# Patient Record
Sex: Female | Born: 1940 | Race: White | Hispanic: No | Marital: Married | Smoking: Never smoker
Health system: Southern US, Community
[De-identification: ages and names within clinical notes are randomized; demographics above are authoritative.]

## PROBLEM LIST (undated history)

## (undated) DIAGNOSIS — R413 Other amnesia: Secondary | ICD-10-CM

## (undated) DIAGNOSIS — E785 Hyperlipidemia, unspecified: Secondary | ICD-10-CM

## (undated) DIAGNOSIS — J45909 Unspecified asthma, uncomplicated: Secondary | ICD-10-CM

## (undated) DIAGNOSIS — Z972 Presence of dental prosthetic device (complete) (partial): Secondary | ICD-10-CM

## (undated) DIAGNOSIS — I10 Essential (primary) hypertension: Secondary | ICD-10-CM

## (undated) HISTORY — PX: ANKLE FRACTURE SURGERY: SHX122

## (undated) HISTORY — PX: GASTRIC BYPASS: SHX52

---

## 2003-03-27 ENCOUNTER — Other Ambulatory Visit: Payer: Self-pay

## 2005-04-24 ENCOUNTER — Other Ambulatory Visit: Payer: Self-pay

## 2005-04-30 ENCOUNTER — Ambulatory Visit: Payer: Self-pay | Admitting: Vascular Surgery

## 2009-05-02 ENCOUNTER — Ambulatory Visit: Payer: Self-pay | Admitting: Gastroenterology

## 2014-12-28 ENCOUNTER — Emergency Department
Admission: EM | Admit: 2014-12-28 | Discharge: 2014-12-28 | Disposition: A | Discharge status: Home / Self Care | Payer: Medicare PPO | Attending: Emergency Medicine | Admitting: Emergency Medicine

## 2014-12-28 DIAGNOSIS — K921 Melena: Secondary | ICD-10-CM | POA: Insufficient documentation

## 2014-12-28 DIAGNOSIS — I10 Essential (primary) hypertension: Secondary | ICD-10-CM | POA: Diagnosis not present

## 2014-12-28 DIAGNOSIS — R195 Other fecal abnormalities: Secondary | ICD-10-CM

## 2014-12-28 DIAGNOSIS — K625 Hemorrhage of anus and rectum: Secondary | ICD-10-CM | POA: Diagnosis present

## 2014-12-28 HISTORY — DX: Essential (primary) hypertension: I10

## 2014-12-28 LAB — CBC
HCT: 40 % (ref 35.0–47.0)
Hemoglobin: 12.9 g/dL (ref 12.0–16.0)
MCH: 28.3 pg (ref 26.0–34.0)
MCHC: 32.3 g/dL (ref 32.0–36.0)
MCV: 87.4 fL (ref 80.0–100.0)
PLATELETS: 239 10*3/uL (ref 150–440)
RBC: 4.57 MIL/uL (ref 3.80–5.20)
RDW: 13.1 % (ref 11.5–14.5)
WBC: 6.5 10*3/uL (ref 3.6–11.0)

## 2014-12-28 LAB — COMPREHENSIVE METABOLIC PANEL
ALBUMIN: UNDETERMINED g/dL (ref 3.5–5.0)
ALK PHOS: 73 U/L (ref 38–126)
ALT: UNDETERMINED U/L (ref 14–54)
ALT: 13 U/L — ABNORMAL LOW (ref 14–54)
AST: 19 U/L (ref 15–41)
AST: 19 U/L (ref 15–41)
Albumin: 4.2 g/dL (ref 3.5–5.0)
Alkaline Phosphatase: 77 U/L (ref 38–126)
Anion gap: 10 (ref 5–15)
Anion gap: 10 (ref 5–15)
BILIRUBIN TOTAL: UNDETERMINED mg/dL (ref 0.3–1.2)
BUN: UNDETERMINED mg/dL (ref 6–20)
BUN: 12 mg/dL (ref 6–20)
CALCIUM: 8.5 mg/dL — AB (ref 8.9–10.3)
CHLORIDE: 97 mmol/L — AB (ref 101–111)
CHLORIDE: 95 mmol/L — AB (ref 101–111)
CO2: 22 mmol/L (ref 22–32)
CO2: 29 mmol/L (ref 22–32)
CREATININE: UNDETERMINED mg/dL (ref 0.44–1.00)
Calcium: 9.1 mg/dL (ref 8.9–10.3)
Creatinine, Ser: 0.68 mg/dL (ref 0.44–1.00)
Glucose, Bld: 93 mg/dL (ref 65–99)
Glucose, Bld: 100 mg/dL — ABNORMAL HIGH (ref 65–99)
POTASSIUM: 3.7 mmol/L (ref 3.5–5.1)
Potassium: 3.8 mmol/L (ref 3.5–5.1)
SODIUM: 129 mmol/L — AB (ref 135–145)
SODIUM: 134 mmol/L — AB (ref 135–145)
Total Bilirubin: 0.5 mg/dL (ref 0.3–1.2)
Total Protein: UNDETERMINED g/dL (ref 6.5–8.1)
Total Protein: 7.2 g/dL (ref 6.5–8.1)

## 2014-12-28 NOTE — Discharge Instructions (Signed)
You have been seen in the emergency department for red stool. Your workup as shown normal results. Your stool does not contain any blood at this time. Please follow-up with your primary care physician as needed, monitor your bowel movements to ensure resolution of the red-colored stool over the next 2-3 days.

## 2014-12-28 NOTE — ED Notes (Signed)
Pt arrives to ER because she is concerned that she is loosing blood through her stool. Reports X 1 BM this AM that was "tinged" pink. Pt hx of "low blood". Pt denies any other sx. Pt alert and oriented X4, active, cooperative, pt in NAD. RR even and unlabored, color WNL.

## 2014-12-28 NOTE — ED Provider Notes (Addendum)
[facility] Emergency Department Provider Note  Time seen: 4:15 PM  I have reviewed the triage vital signs and the nursing notes.   HISTORY  Chief Complaint Rectal Bleeding    HPI Jeanne Stewart is a 74 y.o. female with a past medical history of hypertension who presents the emergency department with red stool. According to the patient she stated she had a bowel movement today and noted that her stool looked pink, and the toilet water looked pink as well.Patient denies any abdominal pain, nausea, vomiting, recent black stool, any other bleeding. Patient denies any history of GI bleeding in the past. Patient states she does take weekly iron supplements for anemia.     Past Medical History  Diagnosis Date  . Hypertension     There are no active problems to display for this patient.   History reviewed. No pertinent past surgical history.  No current outpatient prescriptions on file.  Allergies Review of patient's allergies indicates no known allergies.  No family history on file.  Social History Social History  Substance Use Topics  . Smoking status: Never Smoker   . Smokeless tobacco: None  . Alcohol Use: No    Review of Systems Constitutional: Negative for fever. Cardiovascular: Negative for chest pain. Respiratory: Negative for shortness of breath. Gastrointestinal: Negative for abdominal pain. Positive for red stool 1. Negative for diarrhea. Musculoskeletal: Negative for back pain. Neurological: Negative for headache 10-point ROS otherwise negative.  ____________________________________________   PHYSICAL EXAM:  VITAL SIGNS: ED Triage Vitals  Enc Vitals Group     BP 12/28/14 1454 146/77 mmHg     Pulse Rate 12/28/14 1454 75     Resp 12/28/14 1454 18     Temp 12/28/14 1454 98.1 F (36.7 C)     Temp Source 12/28/14 1454 Oral     SpO2 12/28/14 1454 97 %     Weight 12/28/14 1454 170 lb (77.111 kg)     Height 12/28/14 1454 4'  10" (1.473 m)     Head Cir --      [facility] Flow --      Pain Score --      Pain Loc --      Pain Edu? --      Excl. in GC? --     Constitutional: Alert and oriented. Well appearing and in no distress. Eyes: Normal exam ENT   Head: Normocephalic and atraumatic   Mouth/Throat: Mucous membranes are moist. Cardiovascular: Normal rate, regular rhythm. No murmur Respiratory: Normal respiratory effort without tachypnea nor retractions. Breath sounds are clear  Gastrointestinal: Soft and nontender. No distention. Rectal exam shows red-appearing stool, guaiac negative. Musculoskeletal: Nontender with normal range of motion in all extremities.  Neurologic:  Normal speech and language. No gross focal neurologic deficits Skin:  Skin is warm, dry and intact.  Psychiatric: Mood and affect are normal. Speech and behavior are normal.  ____________________________________________    EKG reviewed and interpreted, so shows normal sinus rhythm at 75 bpm, narrow QRS, normal axis, normal intervals, no ST changes. Overall normal EKG.   INITIAL IMPRESSION / ASSESSMENT AND PLAN / ED COURSE  Pertinent labs & imaging results that were available during my care of the patient were reviewed by me and considered in my medical decision making (see chart for details).  Patient presents with red stool times one bowel movement. Denies any diarrhea, any other bleeding, denies any history of GI bleeding in the past. Rectal exam shows red-appearing stool however  the stool is guaiac-negative. Upon further questioning the patient states she has been eating red velvet cookies, which could be the culprit for her red stool. Labs within normal limits, exam otherwise benign. We'll discharge the patient home with primary care follow-up as needed.    ____________________________________________   FINAL CLINICAL IMPRESSION(S) / ED DIAGNOSES  Red stool   Minna AntisKevin Geneive Sandstrom, MD 12/28/14 [id number]  Minna AntisKevin Odai Wimmer,  MD 12/28/14 [phone]

## 2015-03-31 ENCOUNTER — Encounter: Payer: Self-pay | Admitting: Emergency Medicine

## 2015-03-31 ENCOUNTER — Emergency Department: Payer: Medicare PPO

## 2015-03-31 ENCOUNTER — Emergency Department
Admission: EM | Admit: 2015-03-31 | Discharge: 2015-03-31 | Disposition: A | Discharge status: Home / Self Care | Payer: Medicare PPO | Attending: Emergency Medicine | Admitting: Emergency Medicine

## 2015-03-31 DIAGNOSIS — R05 Cough: Secondary | ICD-10-CM | POA: Diagnosis present

## 2015-03-31 DIAGNOSIS — B349 Viral infection, unspecified: Secondary | ICD-10-CM

## 2015-03-31 DIAGNOSIS — J9801 Acute bronchospasm: Secondary | ICD-10-CM | POA: Insufficient documentation

## 2015-03-31 DIAGNOSIS — Z79899 Other long term (current) drug therapy: Secondary | ICD-10-CM | POA: Diagnosis not present

## 2015-03-31 DIAGNOSIS — I10 Essential (primary) hypertension: Secondary | ICD-10-CM | POA: Insufficient documentation

## 2015-03-31 MED ORDER — ALBUTEROL SULFATE HFA 108 (90 BASE) MCG/ACT IN AERS: 1.0000 | INHALATION_SPRAY | Freq: Four times a day (QID) | RESPIRATORY_TRACT | Status: DC | PRN

## 2015-03-31 NOTE — ED Notes (Signed)
AAOx3.  Skin warm and dry.  NAD 

## 2015-03-31 NOTE — ED Notes (Signed)
Pt to ed with c/o cough, congestion, sob x 5 days.  Denies fever. Denies sore throat.

## 2015-03-31 NOTE — Discharge Instructions (Signed)

## 2015-03-31 NOTE — ED Provider Notes (Signed)
[facility] Emergency Department Provider Note  ____________________________________________  Time seen: Approximately 4:37 PM  I have reviewed the triage vital signs and the nursing notes.   HISTORY  Chief Complaint Cough; Nasal Congestion; and Shortness of Breath    HPI Jeanne Stewart is a 75 y.o. female , NAD, presents to the emergency department with cough, chest congestion and wheezing for approximately 5 days. Patient states that she has a "cold in her chest". States she has long history of asthma and these symptoms occur during weather change. States she uses Combivent inhaler every day but does not have a rescue inhaler. States she normally goes to [facility] for treatment but unfortunately they were closed when she arrived this afternoon. Has not had any fever, chills, body aches, chest pain, back pain, abdominal pain, nausea, vomiting, diarrhea.   Past Medical History  Diagnosis Date  . Hypertension     There are no active problems to display for this patient.   History reviewed. No pertinent past surgical history.  Current Outpatient Rx  Name  Route  Sig  Dispense  Refill  . albuterol (PROVENTIL HFA;VENTOLIN HFA) 108 (90 Base) MCG/ACT inhaler   Inhalation   Inhale 1-2 puffs into the lungs every 6 (six) hours as needed for wheezing or shortness of breath.   1 Inhaler   0   . COMBIVENT RESPIMAT 20-100 MCG/ACT AERS respimat   Oral   Take 1 puff by mouth every 6 (six) hours as needed.       0     Dispense as written.   Marland Kitchen CRESTOR 10 MG tablet   Oral   Take 1 tablet by mouth daily.      0     Dispense as written.   Marland Kitchen lisinopril-hydrochlorothiazide (PRINZIDE,ZESTORETIC) 20-25 MG tablet   Oral   Take 1 tablet by mouth daily.      0   . montelukast (SINGULAIR) 10 MG tablet   Oral   Take 1 tablet by mouth daily as needed.      0   . Vitamin D, Ergocalciferol, (DRISDOL) 50000 UNITS CAPS capsule   Oral   Take 50,000 Units  by mouth once a week.      0     Allergies Review of patient's allergies indicates no known allergies.  History reviewed. No pertinent family history.  Social History Social History  Substance Use Topics  . Smoking status: Never Smoker   . Smokeless tobacco: None  . Alcohol Use: No     Review of Systems Constitutional: No fever/chills, fatigue Eyes: No visual changes. No discharge ENT: No sore throat, nasal congestion, ear pain. Cardiovascular: No chest pain. Respiratory: Positive cough, chest congestion, wheezing.  No wheezing.  Gastrointestinal: No abdominal pain.  No nausea, vomiting.  No diarrhea. Musculoskeletal: Negative for back pain.  Skin: Negative for rash. Neurological: Negative for headaches, focal weakness or numbness. 10-point ROS otherwise negative.  ____________________________________________   PHYSICAL EXAM:  VITAL SIGNS: ED Triage Vitals  Enc Vitals Group     BP 03/31/15 1630 146/64 mmHg     Pulse Rate 03/31/15 1630 88     Resp 03/31/15 1630 22     Temp 03/31/15 1630 97.9 F (36.6 C)     Temp Source 03/31/15 1630 Oral     SpO2 03/31/15 1630 95 %     Weight 03/31/15 1630 165 lb (74.844 kg)     Height 03/31/15 1630  (1.499 m)  Head Cir --      [facility] Flow --      Pain Score 03/31/15 1631 0     Pain Loc --      Pain Edu? --      Excl. in GC? --     Constitutional: Alert and oriented. Well appearing and in no acute distress. Talking in complete sentences and interacting without distress with this provider throughout her visit. Eyes: Conjunctivae are normal. Head: Atraumatic. ENT:      Ears: No discharge      Nose: No congestion/rhinnorhea.      Mouth/Throat: Mucous membranes are moist.  Neck: Supple with full range of motion. Hematological/Lymphatic/Immunilogical: No cervical lymphadenopathy. Cardiovascular: Normal rate, regular rhythm. Normal S1 and S2.  Good peripheral circulation. Respiratory: Normal respiratory effort  without tachypnea or retractions. Mild wheeze noted in upper lung fields without rhonchi or rales. Lower lung fields without wheeze, rhonchi, rales. Breath sounds noted throughout all lung fields. Neurologic:  Normal speech and language. No gross focal neurologic deficits are appreciated.  Skin:  Skin is warm, dry and intact. No rash noted. Psychiatric: Mood and affect are normal. Speech and behavior are normal. Patient exhibits appropriate insight and judgement.   ____________________________________________   LABS  None  ____________________________________________  EKG  None ____________________________________________  RADIOLOGY  None - Patient refused CXR after I fully discussed pros and cons of the imaging process. Patient verbalized that she would know if she had pneumonia and felt her symptoms were consistent with a chest cold.  ____________________________________________    PROCEDURES  Procedure(s) performed: None      Medications - No data to display  Patient declined breathing treatment while in the emergency department. She states she just wants an inhaler she can use at home. ____________________________________________   INITIAL IMPRESSION / ASSESSMENT AND PLAN / ED COURSE  Patient's diagnosis is consistent with acute bronchospasm due to viral infection. Patient will be discharged home with prescriptions for albuterol inhaler to use as directed. [facility] if symptoms persist past this treatment course. Patient is given ED precautions to return to the ED for any worsening or new symptoms.    ____________________________________________  FINAL CLINICAL IMPRESSION(S) / ED DIAGNOSES  Final diagnoses:  Acute bronchospasm due to viral infection      NEW MEDICATIONS STARTED DURING THIS VISIT:  New Prescriptions   ALBUTEROL (PROVENTIL HFA;VENTOLIN HFA) 108 (90 BASE) MCG/ACT INHALER    Inhale 1-2 puffs  into the lungs every 6 (six) hours as needed for wheezing or shortness of breath.         Hope PigeonJami L Latressa Harries, PA-C 03/31/15 1703  Jennye MoccasinBrian S Quigley, MD 03/31/15 Corky [id number]

## 2016-10-24 ENCOUNTER — Emergency Department: Payer: Medicare PPO

## 2016-10-24 ENCOUNTER — Emergency Department
Admission: EM | Admit: 2016-10-24 | Discharge: 2016-10-24 | Disposition: A | Discharge status: Home / Self Care | Payer: Medicare PPO | Attending: Emergency Medicine | Admitting: Emergency Medicine

## 2016-10-24 ENCOUNTER — Encounter: Payer: Self-pay | Admitting: Emergency Medicine

## 2016-10-24 DIAGNOSIS — R0600 Dyspnea, unspecified: Secondary | ICD-10-CM

## 2016-10-24 DIAGNOSIS — I10 Essential (primary) hypertension: Secondary | ICD-10-CM | POA: Diagnosis not present

## 2016-10-24 DIAGNOSIS — Z79899 Other long term (current) drug therapy: Secondary | ICD-10-CM | POA: Diagnosis not present

## 2016-10-24 DIAGNOSIS — J45909 Unspecified asthma, uncomplicated: Secondary | ICD-10-CM | POA: Insufficient documentation

## 2016-10-24 DIAGNOSIS — R0602 Shortness of breath: Secondary | ICD-10-CM | POA: Diagnosis present

## 2016-10-24 LAB — CBC
HEMATOCRIT: 40.6 % (ref 35.0–47.0)
HEMOGLOBIN: 13.9 g/dL (ref 12.0–16.0)
MCH: 29.6 pg (ref 26.0–34.0)
MCHC: 34.3 g/dL (ref 32.0–36.0)
MCV: 86.5 fL (ref 80.0–100.0)
Platelets: 287 10*3/uL (ref 150–440)
RBC: 4.69 MIL/uL (ref 3.80–5.20)
RDW: 13.4 % (ref 11.5–14.5)
WBC: 6.7 10*3/uL (ref 3.6–11.0)

## 2016-10-24 LAB — COMPREHENSIVE METABOLIC PANEL
ALBUMIN: 4.6 g/dL (ref 3.5–5.0)
ALK PHOS: 58 U/L (ref 38–126)
ALT: 20 U/L (ref 14–54)
ANION GAP: 10 (ref 5–15)
AST: 26 U/L (ref 15–41)
BILIRUBIN TOTAL: 0.8 mg/dL (ref 0.3–1.2)
BUN: 18 mg/dL (ref 6–20)
CALCIUM: 9.6 mg/dL (ref 8.9–10.3)
CO2: 28 mmol/L (ref 22–32)
CREATININE: 1.06 mg/dL — AB (ref 0.44–1.00)
Chloride: 93 mmol/L — ABNORMAL LOW (ref 101–111)
GFR calc Af Amer: 57 mL/min — ABNORMAL LOW (ref >60)
GFR calc non Af Amer: 49 mL/min — ABNORMAL LOW (ref >60)
GLUCOSE: 107 mg/dL — AB (ref 65–99)
Potassium: 4.2 mmol/L (ref 3.5–5.1)
Sodium: 131 mmol/L — ABNORMAL LOW (ref 135–145)
TOTAL PROTEIN: 7.8 g/dL (ref 6.5–8.1)

## 2016-10-24 LAB — TROPONIN I: Troponin I: <0.03 ng/mL (ref <0.03)

## 2016-10-24 MED ORDER — IPRATROPIUM-ALBUTEROL 0.5-2.5 (3) MG/3ML IN SOLN
3.0000 mL | Freq: Once | RESPIRATORY_TRACT | Status: AC
  Administered 2016-10-24: 3 mL via RESPIRATORY_TRACT
  Filled 2016-10-24: qty 3

## 2016-10-24 MED ORDER — METHYLPREDNISOLONE SODIUM SUCC 125 MG IJ SOLR
125.0000 mg | Freq: Once | INTRAMUSCULAR | Status: AC
  Administered 2016-10-24: 125 mg via INTRAVENOUS
  Filled 2016-10-24: qty 2

## 2016-10-24 MED ORDER — HYDROMORPHONE HCL 1 MG/ML IJ SOLN
INTRAMUSCULAR | Status: AC
  Filled 2016-10-24: qty 1

## 2016-10-24 MED ORDER — ALBUTEROL SULFATE HFA 108 (90 BASE) MCG/ACT IN AERS: 2.0000 | INHALATION_SPRAY | Freq: Four times a day (QID) | RESPIRATORY_TRACT | 0 refills | Status: DC | PRN

## 2016-10-24 MED ORDER — COMBIVENT RESPIMAT 20-100 MCG/ACT IN AERS: 1.0000 | INHALATION_SPRAY | Freq: Four times a day (QID) | RESPIRATORY_TRACT | 0 refills | Status: DC | PRN

## 2016-10-24 MED ORDER — PREDNISONE 20 MG PO TABS: 40.0000 mg | ORAL_TABLET | Freq: Every day | ORAL | 0 refills | Status: DC

## 2016-10-24 NOTE — Discharge Instructions (Signed)
As we discussed if you have any increased shortness of breath or develop any chest pain please return immediately to the emergency department.  Otherwise please follow-up with your doctor soon as possible.  Take your steroids as prescribed and use your inhalers as needed as written.

## 2016-10-24 NOTE — ED Notes (Signed)

## 2016-10-24 NOTE — ED Triage Notes (Signed)
Pt to ed with c/o labored resp and sob.  Pt with nasal flaring, labored resp, and appears in resp distress.  Pt sats 85% at triage.

## 2016-10-24 NOTE — ED Provider Notes (Signed)
[facility] Emergency Department Provider Note  Time seen: 1:19 PM  I have reviewed the triage vital signs and the nursing notes.   HISTORY  Chief Complaint Shortness of Breath    HPI Jeanne Stewart is a 76 y.o. female with a past medical history of hypertension, asthma, who presents to the emergency department with difficulty breathing.  According to the patient since yesterday she has been having progressively worsening difficulty breathing.  States she is having wheeze, mild chest pain/tightness.  Denies any fever, cough or congestion.  Denies COPD history.  Patient is a non-smoker.  Upon arrival the patient is tachypneic moving little air bilaterally.  Afebrile, but has a room air saturation of 86%.  No home O2 requirement.   Past Medical History:  Diagnosis Date  . Hypertension     There are no active problems to display for this patient.   History reviewed. No pertinent surgical history.  Prior to Admission medications   Medication Sig Start Date End Date Taking? Authorizing Provider  albuterol (PROVENTIL HFA;VENTOLIN HFA) 108 (90 Base) MCG/ACT inhaler Inhale 1-2 puffs into the lungs every 6 (six) hours as needed for wheezing or shortness of breath. 03/31/15   Hagler, Jami L, PA-C  COMBIVENT RESPIMAT 20-100 MCG/ACT AERS respimat Take 1 puff by mouth every 6 (six) hours as needed.  12/17/14   [provider]  CRESTOR 10 MG tablet Take 1 tablet by mouth daily. 12/22/14   [provider]  lisinopril-hydrochlorothiazide (PRINZIDE,ZESTORETIC) 20-25 MG tablet Take 1 tablet by mouth daily. 12/22/14   [provider]  montelukast (SINGULAIR) 10 MG tablet Take 1 tablet by mouth daily as needed. 12/22/14   [provider]  Vitamin D, Ergocalciferol, (DRISDOL) 50000 UNITS CAPS capsule Take 50,000 Units by mouth once a week. 11/07/14   [provider]    No Known Allergies  No family history on file.  Social  History Social History  Substance Use Topics  . Smoking status: Never Smoker  . Smokeless tobacco: Never Used  . Alcohol use No    Review of Systems Constitutional: Negative for fever. Cardiovascular: Negative for chest pain. Respiratory: Positive for shortness of breath Gastrointestinal: Negative for abdominal pain Neurological: Negative for headache All other ROS negative  ____________________________________________   PHYSICAL EXAM:  VITAL SIGNS: ED Triage Vitals [10/24/16 1308]  Enc Vitals Group     BP (!) 166/76     Pulse Rate 96     Resp (!) 36     Temp 98.2 F (36.8 C)     Temp Source Oral     SpO2 (!) 86 %     Weight 165 lb (74.8 kg)     Height      Head Circumference      [facility] Flow      Pain Score 8     Pain Loc      Pain Edu?      Excl. in GC?    Constitutional: Alert and oriented. Well appearing and in no distress. Eyes: Normal exam ENT   Head: Normocephalic and atraumatic   Mouth/Throat: Mucous membranes are moist. Cardiovascular: Normal rate, regular rhythm. No murmur Respiratory: Moderate tachypnea with mild expiratory wheeze and overall decreased breath sounds bilaterally. Gastrointestinal: Soft and nontender. No distention.   Musculoskeletal: Nontender with normal range of motion in all extremities. Neurologic:  Normal speech and language. No gross focal neurologic deficits Skin:  Skin is warm, dry and intact.  Psychiatric: Mood and  affect are normal.   ____________________________________________   RADIOLOGY  Chest x-ray negative  ____________________________________________   INITIAL IMPRESSION / ASSESSMENT AND PLAN / ED COURSE  Pertinent labs & imaging results that were available during my care of the patient were reviewed by me and considered in my medical decision making (see chart for details).  Patient presents to the emergency department for 2 days of worsening shortness of breath.  Patient does have mild expiratory  wheeze on exam with decreased respirations bilaterally.  Differential would include pneumonia, reactive airway disease, COPD, CHF.  Labs are largely at baseline.  Troponin negative.  Chest x-ray is normal.  Patient had a room air saturation of 86% on arrival.  We will treat with duo nebs, Solu-Medrol, continue to closely monitor.  Patient has a room air saturation varying between 92 and 96%.  Patient's wheezes gone.  She states she feels better.  I discussed with the patient given her age and initial hypoxia we could admit her to the hospital for further treatment.  Patient states she would much rather go home.  As she is currently satting 94% throughout my evaluation with a good waveform, respiratory rate has decreased between 12 and 18 and she is in no distress I believe she is safe for discharge home.  I discussed very strict return precautions with her and her family.  We will write prescriptions for Combivent and albuterol.  Patient agreeable to this plan.  EKG reviewed and interpreted by myself shows normal sinus rhythm 82 bpm, widened QRS, normal axis, normal intervals, nonspecific ST changes.   ____________________________________________   FINAL CLINICAL IMPRESSION(S) / ED DIAGNOSES  Reactive airway disease    Minna Antis, MD 10/24/16 1504

## 2017-09-23 ENCOUNTER — Encounter: Payer: Self-pay | Admitting: Emergency Medicine

## 2017-09-23 ENCOUNTER — Emergency Department
Admission: EM | Admit: 2017-09-23 | Discharge: 2017-09-23 | Disposition: A | Discharge status: Home / Self Care | Payer: Medicare HMO | Attending: Emergency Medicine | Admitting: Emergency Medicine

## 2017-09-23 ENCOUNTER — Emergency Department: Payer: Medicare HMO

## 2017-09-23 DIAGNOSIS — R531 Weakness: Secondary | ICD-10-CM | POA: Insufficient documentation

## 2017-09-23 DIAGNOSIS — R51 Headache: Secondary | ICD-10-CM | POA: Diagnosis not present

## 2017-09-23 DIAGNOSIS — R42 Dizziness and giddiness: Secondary | ICD-10-CM | POA: Diagnosis present

## 2017-09-23 DIAGNOSIS — I10 Essential (primary) hypertension: Secondary | ICD-10-CM | POA: Diagnosis not present

## 2017-09-23 DIAGNOSIS — Z79899 Other long term (current) drug therapy: Secondary | ICD-10-CM | POA: Insufficient documentation

## 2017-09-23 LAB — URINALYSIS, COMPLETE (UACMP) WITH MICROSCOPIC
Bacteria, UA: NONE SEEN
Bilirubin Urine: NEGATIVE
GLUCOSE, UA: NEGATIVE mg/dL
HGB URINE DIPSTICK: NEGATIVE
Ketones, ur: 5 mg/dL — AB
LEUKOCYTES UA: NEGATIVE
NITRITE: NEGATIVE
PROTEIN: NEGATIVE mg/dL
SPECIFIC GRAVITY, URINE: 1.006 (ref 1.005–1.030)
pH: 6 (ref 5.0–8.0)

## 2017-09-23 LAB — BASIC METABOLIC PANEL
ANION GAP: 10 (ref 5–15)
BUN: 12 mg/dL (ref 8–23)
CALCIUM: 10.2 mg/dL (ref 8.9–10.3)
CO2: 31 mmol/L (ref 22–32)
Chloride: 94 mmol/L — ABNORMAL LOW (ref 98–111)
Creatinine, Ser: 0.7 mg/dL (ref 0.44–1.00)
GLUCOSE: 136 mg/dL — AB (ref 70–99)
Potassium: 4.1 mmol/L (ref 3.5–5.1)
SODIUM: 135 mmol/L (ref 135–145)

## 2017-09-23 LAB — CBC
HCT: 38.5 % (ref 35.0–47.0)
HEMOGLOBIN: 13.3 g/dL (ref 12.0–16.0)
MCH: 30.7 pg (ref 26.0–34.0)
MCHC: 34.6 g/dL (ref 32.0–36.0)
MCV: 88.8 fL (ref 80.0–100.0)
Platelets: 270 10*3/uL (ref 150–440)
RBC: 4.33 MIL/uL (ref 3.80–5.20)
RDW: 14 % (ref 11.5–14.5)
WBC: 8.2 10*3/uL (ref 3.6–11.0)

## 2017-09-23 LAB — TROPONIN I
Troponin I: <0.03 ng/mL (ref <0.03)
Troponin I: <0.03 ng/mL (ref <0.03)

## 2017-09-23 LAB — GLUCOSE, CAPILLARY: Glucose-Capillary: 124 mg/dL — ABNORMAL HIGH (ref 70–99)

## 2017-09-23 MED ORDER — ONDANSETRON HCL 4 MG/2ML IJ SOLN: 4.0000 mg | Freq: Once | INTRAMUSCULAR | Status: DC | PRN

## 2017-09-23 MED ORDER — ONDANSETRON 4 MG PO TBDP
4.0000 mg | ORAL_TABLET | Freq: Once | ORAL | Status: AC | PRN
  Administered 2017-09-23: 4 mg via ORAL
  Filled 2017-09-23: qty 1

## 2017-09-23 MED ORDER — ONDANSETRON HCL 4 MG/2ML IJ SOLN
4.0000 mg | Freq: Once | INTRAMUSCULAR | Status: AC
  Administered 2017-09-23: 4 mg via INTRAVENOUS
  Filled 2017-09-23: qty 2

## 2017-09-23 MED ORDER — ONDANSETRON HCL 4 MG/2ML IJ SOLN
INTRAMUSCULAR | Status: AC
  Filled 2017-09-23: qty 2

## 2017-09-23 NOTE — ED Provider Notes (Signed)
-----------------------------------------   11:17 PM on 09/23/2017 -----------------------------------------   I took over care on this patient from Dr. Darnelle CatalanMalinda.  She was pending MRI of the brain and repeat troponin.  Both these are complete and the results are negative.  The patient continues to appear comfortable.  She is stable for discharge home.  Return precautions given, and she expressed understanding.   Dionne BucySiadecki, Jasmynn Pfalzgraf, MD 09/23/17 2317

## 2017-09-23 NOTE — ED Provider Notes (Signed)
[facility] Emergency Department Provider Note   ____________________________________________   First MD Initiated Contact with Patient 09/23/17 1706     (approximate)  I have reviewed the triage vital signs and the nursing notes.   HISTORY  Chief Complaint Dizziness    HPI Jeanne Stewart is a [age] y.o. female patient reports she was sitting at the dinner table and got dizzy and nauseated.  She says the dizziness was more like feeling worse seeing the plate that was in front of her move and she could not catch up with the plate because it was moving away from her.  She then got a mild headache.  This is not a severe headache at all just mild kind of diffuse in the upper back part of the head she says.  She still having the headache.  The feeling of things moving of the plate moving is now gone.  Patient has not eaten anything except for a few bites when the plate was moving since yesterday.  She says she only usually eats once a day and not very much when she eats.   Past Medical History:  Diagnosis Date  . Hypertension     There are no active problems to display for this patient.   History reviewed. No pertinent surgical history.  Prior to Admission medications   Medication Sig Start Date End Date Taking? Authorizing Provider  donepezil (ARICEPT) 5 MG tablet Take 5 mg by mouth at bedtime.   Yes [provider]  fluticasone furoate-vilanterol (BREO ELLIPTA) 100-25 MCG/INH AEPB Inhale 1 puff into the lungs daily.   Yes [provider]  lisinopril-hydrochlorothiazide (PRINZIDE,ZESTORETIC) 20-25 MG tablet Take 1 tablet by mouth daily. 12/22/14  Yes [provider]  montelukast (SINGULAIR) 10 MG tablet Take 10 mg by mouth daily.  12/22/14  Yes [provider]  rosuvastatin (CRESTOR) 10 MG tablet Take 10 mg by mouth daily. 09/29/16  Yes [provider]  albuterol (PROVENTIL HFA;VENTOLIN HFA) 108 (90 Base) MCG/ACT  inhaler Inhale 2 puffs into the lungs every 6 (six) hours as needed for wheezing or shortness of breath. Patient not taking: Reported on 09/23/2017 10/24/16   Minna AntisPaduchowski, Kevin, MD  COMBIVENT RESPIMAT 20-100 MCG/ACT AERS respimat Inhale 1 puff into the lungs every 6 (six) hours as needed for wheezing or shortness of breath. Patient not taking: Reported on 09/23/2017 10/24/16   Minna AntisPaduchowski, Kevin, MD    Allergies Patient has no known allergies.  No family history on file.  Social History Social History   Tobacco Use  . Smoking status: Never Smoker  . Smokeless tobacco: Never Used  Substance Use Topics  . Alcohol use: No  . Drug use: Not on file    Review of Systems  Constitutional: No fever/chills Eyes: No visual changes. ENT: No sore throat. Cardiovascular: Denies chest pain. Respiratory: Denies shortness of breath. Gastrointestinal: No abdominal pain.  No nausea, no vomiting.  No diarrhea.  No constipation. Genitourinary: Negative for dysuria. Musculoskeletal: Negative for back pain. Skin: Negative for rash. Neurological: Negative for headaches, focal weakness  ____________________________________________   PHYSICAL EXAM:  VITAL SIGNS: ED Triage Vitals  Enc Vitals Group     BP 09/23/17 1507 (!) 184/74     Pulse Rate 09/23/17 1505 72     Resp 09/23/17 1505 16     Temp 09/23/17 1505 97.8 F (36.6 C)     Temp Source 09/23/17 1505 Oral     SpO2 09/23/17 1505 95 %  Weight 09/23/17 1507 164 lb 14.5 oz (74.8 kg)     Height 09/23/17 1507 4\' 11"  (1.499 m)     Head Circumference --      Peak Flow --      Pain Score 09/23/17 1507 6     Pain Loc --      Pain Edu? --      Excl. in GC? --     Constitutional: Alert and oriented. Well appearing and in no acute distress. Eyes: Conjunctivae are normal. PERRL. EOMI. fundi are difficult to see but appear normal Head: Atraumatic. Nose: No congestion/rhinnorhea. Mouth/Throat: Mucous membranes are moist.  Oropharynx  non-erythematous. Neck: No stridor.   Cardiovascular: Normal rate, regular rhythm. Grossly normal heart sounds.  Good peripheral circulation. Respiratory: Normal respiratory effort.  No retractions. Lungs CTAB. Gastrointestinal: Soft and nontender. No distention. No abdominal bruits. No CVA tenderness. Musculoskeletal: No lower extremity tenderness nor edema.   Neurologic:  Normal speech and language. No gross focal neurologic deficits are appreciated.  Specifically cranial nerves II through XII are intact although visual fields were not checked cerebellar finger-to-nose rapid alternating movements and hands and heel-to-shin are all normal.  Patient does not complain of any numbness.  Motor strength is 5/5 throughout. Skin:  Skin is warm, dry and intact.  Psychiatric: Mood and affect are normal. Speech and behavior are normal.  ____________________________________________   LABS (all labs ordered are listed, but only abnormal results are displayed)  Labs Reviewed  BASIC METABOLIC PANEL - Abnormal; Notable for the following components:      Result Value   Chloride 94 (*)    Glucose, Bld 136 (*)    All other components within normal limits  URINALYSIS, COMPLETE (UACMP) WITH MICROSCOPIC - Abnormal; Notable for the following components:   Color, Urine STRAW (*)    APPearance CLEAR (*)    Ketones, ur 5 (*)    All other components within normal limits  GLUCOSE, CAPILLARY - Abnormal; Notable for the following components:   Glucose-Capillary 124 (*)    All other components within normal limits  CBC  TROPONIN I  TROPONIN I  CBG MONITORING, ED   ____________________________________________  EKG  EKG read interpreted by me shows normal sinus rhythm rate of 73 normal axis right bundle branch block no acute ST-T changes similar to prior ____________________________________________  RADIOLOGY  ED MD interpretation: X-ray shows no acute disease CT of the head also no acute disease either  read by radiology and reviewed by me  Official radiology report(s): Ct Head Wo Contrast  Result Date: 09/23/2017 CLINICAL DATA:  Weakness and dizziness. EXAM: CT HEAD WITHOUT CONTRAST TECHNIQUE: Contiguous axial images were obtained from the base of the skull through the vertex without intravenous contrast. COMPARISON:  None. FINDINGS: Brain: There is no mass, hemorrhage or extra-axial collection. The size and configuration of the ventricles and extra-axial CSF spaces are normal. There is no acute or chronic infarction. The brain parenchyma is normal. Vascular: No abnormal hyperdensity of the major intracranial arteries or dural venous sinuses. No intracranial atherosclerosis. Skull: The visualized skull base, calvarium and extracranial soft tissues are normal. Sinuses/Orbits: No fluid levels or advanced mucosal thickening of the visualized paranasal sinuses. No mastoid or middle ear effusion. The orbits are normal. IMPRESSION: Normal brain. Electronically Signed   By: Deatra Robinson M.D.   On: 09/23/2017 18:00   Dg Chest Portable 1 View  Result Date: 09/23/2017 CLINICAL DATA:  Weakness and dizziness. EXAM: PORTABLE CHEST 1 VIEW COMPARISON:  Chest radiograph 10/24/2016 FINDINGS: Small hiatal hernia. Lungs are clear. Normal cardiomediastinal contours. No pleural effusion or pneumothorax. IMPRESSION: Clear lungs.  Small hiatal hernia. Electronically Signed   By: Deatra Robinson M.D.   On: 09/23/2017 17:57    ____________________________________________   PROCEDURES  Procedure(s) performed:   Procedures  Critical Care performed:   ____________________________________________   INITIAL IMPRESSION / ASSESSMENT AND PLAN / ED COURSE Discussed patient with on-call neurologist from [facility].  He said to call tele-neurology even for non-stroke consult.  Tele-neurology says that we should get an MRI she should be okay to go home with the MRI is negative.  MRI is still pending as is a second  troponin sign the patient out to Dr. Marisa Severin.         ____________________________________________   FINAL CLINICAL IMPRESSION(S) / ED DIAGNOSES  Final diagnoses:  Dizziness     ED Discharge Orders    None       Note:  This document was prepared using Dragon voice recognition software and may include unintentional dictation errors.    Arnaldo Natal, MD 09/23/17 2049

## 2017-09-23 NOTE — ED Notes (Signed)
Pt refusing to eat/drink at this time for swallow eval and meal tray.

## 2017-09-23 NOTE — ED Triage Notes (Signed)
Pt arrived with family with concerns over dizziness and feeling of nausea. Pt states at 1330 pt felt very dizzy, "I thought I was going to pass out." Unclear if pt had loc but states she then felt nauseated and a headache started. Pt a& o in triage.

## 2017-09-23 NOTE — ED Notes (Signed)
Pt up to bed side to void , pt observed to have a steady gate at this time .

## 2017-09-23 NOTE — Discharge Instructions (Signed)
Return to the ER for new, worsening, persistent severe dizziness or any other new or worsening symptoms that concern he.

## 2017-09-23 NOTE — ED Notes (Signed)
Pt eating cold food tray at this time .

## 2017-09-23 NOTE — ED Notes (Signed)
Spoke with MD Cyril LoosenKinner, see new orders.

## 2017-09-23 NOTE — ED Notes (Signed)
Pt to MRI at this time.

## 2017-09-23 NOTE — ED Notes (Signed)
Informed RN that patient has been roomed and is ready for evaluation.  Patient in NAD at this time and call bell placed within reach.   

## 2018-01-06 ENCOUNTER — Emergency Department: Payer: Medicare HMO

## 2018-01-06 ENCOUNTER — Encounter: Payer: Self-pay | Admitting: Emergency Medicine

## 2018-01-06 ENCOUNTER — Emergency Department
Admission: EM | Admit: 2018-01-06 | Discharge: 2018-01-06 | Disposition: A | Discharge status: Home / Self Care | Payer: Medicare HMO | Attending: Emergency Medicine | Admitting: Emergency Medicine

## 2018-01-06 DIAGNOSIS — I10 Essential (primary) hypertension: Secondary | ICD-10-CM | POA: Insufficient documentation

## 2018-01-06 DIAGNOSIS — J45909 Unspecified asthma, uncomplicated: Secondary | ICD-10-CM | POA: Diagnosis not present

## 2018-01-06 DIAGNOSIS — R634 Abnormal weight loss: Secondary | ICD-10-CM | POA: Diagnosis not present

## 2018-01-06 DIAGNOSIS — K449 Diaphragmatic hernia without obstruction or gangrene: Secondary | ICD-10-CM

## 2018-01-06 DIAGNOSIS — R109 Unspecified abdominal pain: Secondary | ICD-10-CM | POA: Diagnosis present

## 2018-01-06 DIAGNOSIS — R1013 Epigastric pain: Secondary | ICD-10-CM

## 2018-01-06 DIAGNOSIS — Z79899 Other long term (current) drug therapy: Secondary | ICD-10-CM | POA: Insufficient documentation

## 2018-01-06 HISTORY — DX: Unspecified asthma, uncomplicated: J45.909

## 2018-01-06 LAB — COMPREHENSIVE METABOLIC PANEL
ALBUMIN: 3.8 g/dL (ref 3.5–5.0)
ALT: 15 U/L (ref 0–44)
AST: 23 U/L (ref 15–41)
Alkaline Phosphatase: 61 U/L (ref 38–126)
Anion gap: 10 (ref 5–15)
BILIRUBIN TOTAL: 0.6 mg/dL (ref 0.3–1.2)
BUN: 16 mg/dL (ref 8–23)
CHLORIDE: 94 mmol/L — AB (ref 98–111)
CO2: 27 mmol/L (ref 22–32)
Calcium: 8.8 mg/dL — ABNORMAL LOW (ref 8.9–10.3)
Creatinine, Ser: 0.64 mg/dL (ref 0.44–1.00)
GFR calc Af Amer: >60 mL/min (ref >60)
GFR calc non Af Amer: >60 mL/min (ref >60)
Glucose, Bld: 118 mg/dL — ABNORMAL HIGH (ref 70–99)
POTASSIUM: 3.2 mmol/L — AB (ref 3.5–5.1)
Sodium: 131 mmol/L — ABNORMAL LOW (ref 135–145)
Total Protein: 7 g/dL (ref 6.5–8.1)

## 2018-01-06 LAB — URINALYSIS, COMPLETE (UACMP) WITH MICROSCOPIC
Bacteria, UA: NONE SEEN
Bilirubin Urine: NEGATIVE
Glucose, UA: NEGATIVE mg/dL
Hgb urine dipstick: NEGATIVE
Ketones, ur: NEGATIVE mg/dL
Leukocytes, UA: NEGATIVE
Nitrite: NEGATIVE
Protein, ur: NEGATIVE mg/dL
SPECIFIC GRAVITY, URINE: 1.01 (ref 1.005–1.030)
pH: 6 (ref 5.0–8.0)

## 2018-01-06 LAB — CBC
HCT: 37.7 % (ref 36.0–46.0)
HEMOGLOBIN: 12.5 g/dL (ref 12.0–15.0)
MCH: 29.3 pg (ref 26.0–34.0)
MCHC: 33.2 g/dL (ref 30.0–36.0)
MCV: 88.3 fL (ref 80.0–100.0)
Platelets: 216 10*3/uL (ref 150–400)
RBC: 4.27 MIL/uL (ref 3.87–5.11)
RDW: 12.5 % (ref 11.5–15.5)
WBC: 7.1 10*3/uL (ref 4.0–10.5)
nRBC: 0 % (ref 0.0–0.2)

## 2018-01-06 LAB — LIPASE, BLOOD: LIPASE: 32 U/L (ref 11–51)

## 2018-01-06 MED ORDER — PANTOPRAZOLE SODIUM 40 MG PO TBEC
40.0000 mg | DELAYED_RELEASE_TABLET | Freq: Every day | ORAL | Status: DC
  Administered 2018-01-06: 40 mg via ORAL
  Filled 2018-01-06: qty 1

## 2018-01-06 MED ORDER — IOHEXOL 300 MG/ML  SOLN
100.0000 mL | Freq: Once | INTRAMUSCULAR | Status: AC | PRN
  Administered 2018-01-06: 100 mL via INTRAVENOUS
  Filled 2018-01-06: qty 100

## 2018-01-06 MED ORDER — IOPAMIDOL (ISOVUE-300) INJECTION 61%
30.0000 mL | Freq: Once | INTRAVENOUS | Status: AC | PRN
  Administered 2018-01-06: 30 mL via ORAL
  Filled 2018-01-06: qty 30

## 2018-01-06 MED ORDER — ESOMEPRAZOLE MAGNESIUM 20 MG PO CPDR: 20.0000 mg | DELAYED_RELEASE_CAPSULE | Freq: Every day | ORAL | 1 refills | Status: DC

## 2018-01-06 MED ORDER — ONDANSETRON 4 MG PO TBDP
4.0000 mg | ORAL_TABLET | Freq: Once | ORAL | Status: AC | PRN
  Administered 2018-01-06: 4 mg via ORAL
  Filled 2018-01-06: qty 1

## 2018-01-06 NOTE — Discharge Instructions (Addendum)
Take the Nexium 1 pill a day to see if that will help with your symptoms.  Follow-up with your primary care doctor in the next week or 2.  Please return for increasing pain fever vomiting or feeling sicker.

## 2018-01-06 NOTE — ED Notes (Signed)
Pt brought to room 33 in ED.  Pt ambulated with ease with family.  Pt denies vomiting since waiting to be seen,  Continues to report nausea.

## 2018-01-06 NOTE — ED Triage Notes (Signed)
Patient presents to the ED with nausea that began approx. 1 hour ago.  Patient also complaining of upper abdominal pain as well.  Patient denies vomiting or diarrhea.  Patient reports eating toast for breakfast.  Patient states she thinks her last BM was 2 days ago.

## 2018-01-06 NOTE — ED Provider Notes (Signed)
[facility] Emergency Department Provider Note   ____________________________________________   First MD Initiated Contact with Patient 01/06/18 1547     (approximate)  I have reviewed the triage vital signs and the nursing notes.   HISTORY  Chief Complaint Nausea    HPI Jeanne Stewart is a 78 y.o. female who reports for the last few days and weeks she is had a feeling like a rock comes into her stomach and radiates through the back after she eats.  It lasts for a while half an hour longer and then goes away.  She says it feels like a bad toothache or sore throat but in her stomach.  Patient reports she is lost weight about 20 pounds over the last few months.  Patient is not currently hurting.  He only ate some toast this morning and that brought on the pain.  Nothing seems to make the pain better or worse.   Past Medical History:  Diagnosis Date  . Asthma   . Hypertension     There are no active problems to display for this patient.   History reviewed. No pertinent surgical history.  Prior to Admission medications   Medication Sig Start Date End Date Taking? Authorizing Provider  albuterol (PROVENTIL HFA;VENTOLIN HFA) 108 (90 Base) MCG/ACT inhaler Inhale 2 puffs into the lungs every 6 (six) hours as needed for wheezing or shortness of breath. Patient not taking: Reported on 09/23/2017 10/24/16   Minna Antis, MD  COMBIVENT RESPIMAT 20-100 MCG/ACT AERS respimat Inhale 1 puff into the lungs every 6 (six) hours as needed for wheezing or shortness of breath. Patient not taking: Reported on 09/23/2017 10/24/16   Minna Antis, MD  donepezil (ARICEPT) 5 MG tablet Take 5 mg by mouth at bedtime.    [provider]  esomeprazole (NEXIUM) 20 MG capsule Take 1 capsule (20 mg total) by mouth daily. 01/06/18 01/06/19  Arnaldo Natal, MD  fluticasone furoate-vilanterol (BREO ELLIPTA) 100-25 MCG/INH AEPB Inhale 1 puff into the lungs daily.     [provider]  lisinopril-hydrochlorothiazide (PRINZIDE,ZESTORETIC) 20-25 MG tablet Take 1 tablet by mouth daily. 12/22/14   [provider]  montelukast (SINGULAIR) 10 MG tablet Take 10 mg by mouth daily.  12/22/14   [provider]  rosuvastatin (CRESTOR) 10 MG tablet Take 10 mg by mouth daily. 09/29/16   [provider]    Allergies Patient has no known allergies.  No family history on file.  Social History Social History   Tobacco Use  . Smoking status: Never Smoker  . Smokeless tobacco: Never Used  Substance Use Topics  . Alcohol use: No  . Drug use: Not on file    Review of Systems  Constitutional: No fever/chills Eyes: No visual changes. ENT: No sore throat. Cardiovascular: Denies chest pain. Respiratory: Denies shortness of breath. Gastrointestinal: See HPI n. Genitourinary: Negative for dysuria. Musculoskeletal: Negative for back pain. Skin: Negative for rash. Neurological: Negative for headaches, focal weakness   ____________________________________________   PHYSICAL EXAM:  VITAL SIGNS: ED Triage Vitals  Enc Vitals Group     BP 01/06/18 1319 (!) 127/54     Pulse Rate 01/06/18 1319 79     Resp 01/06/18 1319 18     Temp 01/06/18 1319 98.6 F (37 C)     Temp Source 01/06/18 1319 Oral     SpO2 01/06/18 1319 93 %     Weight 01/06/18 1323 165 lb 5.5 oz (75 kg)  Height 01/06/18 1320 4\' 11"  (1.499 m)     Head Circumference --      Peak Flow --      Pain Score 01/06/18 1319 8     Pain Loc --      Pain Edu? --      Excl. in GC? --     Constitutional: Alert and oriented. Well appearing and in no acute distress. Eyes: Conjunctivae are normal.  Head: Atraumatic. Nose: No congestion/rhinnorhea. Mouth/Throat: Mucous membranes are moist.  Oropharynx non-erythematous. Neck: No stridor.   Cardiovascular: Normal rate, regular rhythm. Grossly normal heart sounds.  Good peripheral circulation. Respiratory: Normal  respiratory effort.  No retractions. Lungs CTAB. Gastrointestinal: Soft and nontender. No distention. No abdominal bruits. No CVA tenderness. Musculoskeletal: No lower extremity tenderness nor edema. Neurologic:  Normal speech and language. No gross focal neurologic deficits are appreciated. No gait instability. Skin:  Skin is warm, dry and intact. No rash noted. Psychiatric: Mood and affect are normal. Speech and behavior are normal.  ____________________________________________   LABS (all labs ordered are listed, but only abnormal results are displayed)  Labs Reviewed  COMPREHENSIVE METABOLIC PANEL - Abnormal; Notable for the following components:      Result Value   Sodium 131 (*)    Potassium 3.2 (*)    Chloride 94 (*)    Glucose, Bld 118 (*)    Calcium 8.8 (*)    All other components within normal limits  URINALYSIS, COMPLETE (UACMP) WITH MICROSCOPIC - Abnormal; Notable for the following components:   Color, Urine YELLOW (*)    APPearance CLEAR (*)    All other components within normal limits  LIPASE, BLOOD  CBC   ____________________________________________  EKG  KG read and interpreted by me shows normal sinus rhythm rate of 77 normal axis right bundle branch block no acute changes. ____________________________________________  RADIOLOGY  ED MD interpretation: E of the chest shows no acute disease there is a moderate hiatal hernia.  CT of the abdomen shows also no acute disease both were read by radiology and reviewed by me.  Official radiology report(s): Dg Chest 2 View  Result Date: 01/06/2018 CLINICAL DATA:  Nausea and upper abdominal pain. Unintended weight loss. EXAM: CHEST - 2 VIEW COMPARISON:  09/23/2017 and 10/24/2016 FINDINGS: The heart size and pulmonary vascularity are normal. No infiltrates or effusions. Moderate hiatal hernia, unchanged. Chronic cancellation of the thoracic kyphosis. IMPRESSION: No active cardiopulmonary disease. Electronically Signed    By: Francene BoyersJames  Maxwell M.D.   On: 01/06/2018 16:18   Ct Abdomen Pelvis W Contrast  Result Date: 01/06/2018 CLINICAL DATA:  Generalized abdominal pain EXAM: CT ABDOMEN AND PELVIS WITH CONTRAST TECHNIQUE: Multidetector CT imaging of the abdomen and pelvis was performed using the standard protocol following bolus administration of intravenous contrast. CONTRAST:  100mL OMNIPAQUE IOHEXOL 300 MG/ML  SOLN COMPARISON:  CT report 03/27/2003 FINDINGS: Lower chest: Lung bases demonstrate no acute consolidation or effusion. Moderate to large hiatal hernia with postsurgical changes at the proximal stomach. Hepatobiliary: Subcentimeter hypodense lesions in the liver too small to further characterize. Small cyst in the inferior right hepatic lobe. No calcified gallstone or biliary dilatation. Pancreas: Unremarkable. No pancreatic ductal dilatation or surrounding inflammatory changes. Spleen: Normal in size without focal abnormality. Adrenals/Urinary Tract: Adrenal glands are normal. Cyst in the mid right kidney. No hydronephrosis. The bladder is nearly empty. Stomach/Bowel: Stomach is within normal limits. Appendix appears normal. No evidence of bowel wall thickening, distention, or inflammatory changes. Sigmoid colon diverticular disease  without acute inflammatory process. Vascular/Lymphatic: Moderate aortic atherosclerosis without aneurysm. No significantly enlarged lymph nodes. Reproductive: Uterus and bilateral adnexa are unremarkable. Other: Negative for free air or free fluid. Musculoskeletal: Degenerative changes of the spine. No acute or suspicious abnormality IMPRESSION: 1. No CT evidence for acute intra-abdominal or pelvic abnormality. 2. Moderate hiatal hernia 3. Sigmoid colon diverticular disease without acute inflammatory process. Electronically Signed   By: Jasmine Pang M.D.   On: 01/06/2018 17:49    ____________________________________________   PROCEDURES  Procedure(s) performed:  Procedures  Critical  Care performed:   ____________________________________________   INITIAL IMPRESSION / ASSESSMENT AND PLAN / ED COURSE   Patient with nothing that would explain her symptoms.  It may be due to her hiatal hernia.  I will give her some Nexium to see if that helps and have her follow-up with her doctor.       ____________________________________________   FINAL CLINICAL IMPRESSION(S) / ED DIAGNOSES  Final diagnoses:  Epigastric pain  Hiatal hernia     ED Discharge Orders         Ordered    esomeprazole (NEXIUM) 20 MG capsule  Daily     01/06/18 1802           Note:  This document was prepared using Dragon voice recognition software and may include unintentional dictation errors.     Arnaldo Natal, MD 01/06/18 [phone]

## 2019-08-17 ENCOUNTER — Other Ambulatory Visit: Payer: Self-pay

## 2019-08-17 ENCOUNTER — Encounter: Payer: Self-pay | Admitting: Ophthalmology

## 2019-08-19 ENCOUNTER — Other Ambulatory Visit: Payer: Self-pay

## 2019-08-19 ENCOUNTER — Other Ambulatory Visit
Admission: RE | Admit: 2019-08-19 | Discharge: 2019-08-19 | Disposition: A | Discharge status: Home / Self Care | Payer: Medicare HMO | Source: Ambulatory Visit | Attending: Pediatric Dentistry | Admitting: Pediatric Dentistry

## 2019-08-19 DIAGNOSIS — Z01812 Encounter for preprocedural laboratory examination: Secondary | ICD-10-CM | POA: Diagnosis present

## 2019-08-19 DIAGNOSIS — Z20822 Contact with and (suspected) exposure to covid-19: Secondary | ICD-10-CM | POA: Insufficient documentation

## 2019-08-19 NOTE — Discharge Instructions (Signed)

## 2019-08-20 LAB — SARS CORONAVIRUS 2 (TAT 6-24 HRS): SARS Coronavirus 2: NEGATIVE

## 2019-08-23 ENCOUNTER — Encounter: Payer: Self-pay | Admitting: Ophthalmology

## 2019-08-23 ENCOUNTER — Ambulatory Visit: Payer: Medicare HMO | Admitting: Anesthesiology

## 2019-08-23 ENCOUNTER — Other Ambulatory Visit: Payer: Self-pay

## 2019-08-23 ENCOUNTER — Ambulatory Visit
Admission: RE | Admit: 2019-08-23 | Discharge: 2019-08-23 | Disposition: A | Discharge status: Home / Self Care | Payer: Medicare HMO | Attending: Ophthalmology | Admitting: Ophthalmology

## 2019-08-23 ENCOUNTER — Encounter
Admission: RE | Disposition: A | Discharge status: Home / Self Care | Payer: Self-pay | Source: Home / Self Care | Attending: Ophthalmology

## 2019-08-23 DIAGNOSIS — E669 Obesity, unspecified: Secondary | ICD-10-CM

## 2019-08-23 DIAGNOSIS — Z79899 Other long term (current) drug therapy: Secondary | ICD-10-CM | POA: Diagnosis not present

## 2019-08-23 DIAGNOSIS — E7800 Pure hypercholesterolemia, unspecified: Secondary | ICD-10-CM | POA: Diagnosis not present

## 2019-08-23 DIAGNOSIS — I10 Essential (primary) hypertension: Secondary | ICD-10-CM | POA: Insufficient documentation

## 2019-08-23 DIAGNOSIS — H2511 Age-related nuclear cataract, right eye: Secondary | ICD-10-CM | POA: Diagnosis present

## 2019-08-23 HISTORY — DX: Presence of dental prosthetic device (complete) (partial): Z97.2

## 2019-08-23 HISTORY — DX: Hyperlipidemia, unspecified: E78.5

## 2019-08-23 HISTORY — DX: Other amnesia: R41.3

## 2019-08-23 HISTORY — PX: CATARACT EXTRACTION W/PHACO: SHX586

## 2019-08-23 SURGERY — PHACOEMULSIFICATION, CATARACT, WITH IOL INSERTION
Anesthesia: Monitor Anesthesia Care | Site: Eye | Laterality: Right

## 2019-08-23 MED ORDER — ACETAMINOPHEN 160 MG/5ML PO SOLN: 325.0000 mg | ORAL | Status: DC | PRN

## 2019-08-23 MED ORDER — MIDAZOLAM HCL 2 MG/2ML IJ SOLN
INTRAMUSCULAR | Status: DC | PRN
  Administered 2019-08-23: .5 mg via INTRAVENOUS

## 2019-08-23 MED ORDER — ACETAMINOPHEN 325 MG PO TABS: 325.0000 mg | ORAL_TABLET | ORAL | Status: DC | PRN

## 2019-08-23 MED ORDER — ARMC OPHTHALMIC DILATING DROPS
1.0000 "application " | OPHTHALMIC | Status: DC | PRN
  Administered 2019-08-23 (×3): 1 via OPHTHALMIC

## 2019-08-23 MED ORDER — BRIMONIDINE TARTRATE-TIMOLOL 0.2-0.5 % OP SOLN
OPHTHALMIC | Status: DC | PRN
  Administered 2019-08-23: 1 [drp] via OPHTHALMIC

## 2019-08-23 MED ORDER — LIDOCAINE HCL (PF) 2 % IJ SOLN
INTRAOCULAR | Status: DC | PRN
  Administered 2019-08-23: 2 mL

## 2019-08-23 MED ORDER — IPRATROPIUM-ALBUTEROL 0.5-2.5 (3) MG/3ML IN SOLN
3.0000 mL | Freq: Once | RESPIRATORY_TRACT | Status: AC
  Administered 2019-08-23: 3 mL via RESPIRATORY_TRACT

## 2019-08-23 MED ORDER — LACTATED RINGERS IV SOLN: INTRAVENOUS | Status: DC

## 2019-08-23 MED ORDER — NA CHONDROIT SULF-NA HYALURON 40-17 MG/ML IO SOLN
INTRAOCULAR | Status: DC | PRN
  Administered 2019-08-23: 1 mL via INTRAOCULAR

## 2019-08-23 MED ORDER — MOXIFLOXACIN HCL 0.5 % OP SOLN
OPHTHALMIC | Status: DC | PRN
  Administered 2019-08-23: 0.2 mL via OPHTHALMIC

## 2019-08-23 MED ORDER — FENTANYL CITRATE (PF) 100 MCG/2ML IJ SOLN
INTRAMUSCULAR | Status: DC | PRN
  Administered 2019-08-23: 50 ug via INTRAVENOUS

## 2019-08-23 MED ORDER — TETRACAINE HCL 0.5 % OP SOLN
1.0000 [drp] | OPHTHALMIC | Status: DC | PRN
  Administered 2019-08-23 (×4): 1 [drp] via OPHTHALMIC

## 2019-08-23 MED ORDER — ONDANSETRON HCL 4 MG/2ML IJ SOLN: 4.0000 mg | Freq: Once | INTRAMUSCULAR | Status: DC | PRN

## 2019-08-23 MED ORDER — EPINEPHRINE PF 1 MG/ML IJ SOLN
INTRAOCULAR | Status: DC | PRN
  Administered 2019-08-23: 62 mL via OPHTHALMIC

## 2019-08-23 SURGICAL SUPPLY — 22 items
CANNULA ANT/CHMB 27G (MISCELLANEOUS) ×2 IMPLANT
CANNULA ANT/CHMB 27GA (MISCELLANEOUS) ×6 IMPLANT
GLOVE SURG LX 8.0 MICRO (GLOVE) ×2
GLOVE SURG LX STRL 8.0 MICRO (GLOVE) ×1 IMPLANT
GLOVE SURG TRIUMPH 8.0 PF LTX (GLOVE) ×3 IMPLANT
GOWN STRL REUS W/ TWL LRG LVL3 (GOWN DISPOSABLE) ×2 IMPLANT
GOWN STRL REUS W/TWL LRG LVL3 (GOWN DISPOSABLE) ×6
LENS IOL DIOP 22.0 (Intraocular Lens) ×3 IMPLANT
LENS IOL TECNIS MONO 22.0 (Intraocular Lens) IMPLANT
MARKER SKIN DUAL TIP RULER LAB (MISCELLANEOUS) ×3 IMPLANT
NDL FILTER BLUNT 18X1 1/2 (NEEDLE) ×1 IMPLANT
NEEDLE FILTER BLUNT 18X 1/2SAF (NEEDLE) ×2
NEEDLE FILTER BLUNT 18X1 1/2 (NEEDLE) ×1 IMPLANT
PACK EYE AFTER SURG (MISCELLANEOUS) ×3 IMPLANT
PACK OPTHALMIC (MISCELLANEOUS) ×3 IMPLANT
PACK PORFILIO (MISCELLANEOUS) ×3 IMPLANT
SUT ETHILON 10-0 CS-B-6CS-B-6 (SUTURE)
SUTURE EHLN 10-0 CS-B-6CS-B-6 (SUTURE) IMPLANT
SYR 3ML LL SCALE MARK (SYRINGE) ×3 IMPLANT
SYR TB 1ML LUER SLIP (SYRINGE) ×3 IMPLANT
WATER STERILE IRR 250ML POUR (IV SOLUTION) ×3 IMPLANT
WIPE NON LINTING 3.25X3.25 (MISCELLANEOUS) ×3 IMPLANT

## 2019-08-23 NOTE — Anesthesia Procedure Notes (Signed)
Procedure Name: MAC Date/Time: 08/23/2019 9:59 AM Performed by: Silvana Newness, CRNA Pre-anesthesia Checklist: Patient identified, Emergency Drugs available, Suction available, Patient being monitored and Timeout performed Patient Re-evaluated:Patient Re-evaluated prior to induction Oxygen Delivery Method: Nasal cannula Placement Confirmation: positive ETCO2

## 2019-08-23 NOTE — Transfer of Care (Signed)
Immediate Anesthesia Transfer of Care Note  Patient: Jeanne Stewart  Procedure(s) Performed: CATARACT EXTRACTION PHACO AND INTRAOCULAR LENS PLACEMENT (IOC) RIGHT (Right Eye)  Patient Location: PACU  Anesthesia Type: MAC  Level of Consciousness: awake, alert  and patient cooperative  Airway and Oxygen Therapy: Patient Spontanous Breathing and Patient connected to supplemental oxygen  Post-op Assessment: Post-op Vital signs reviewed, Patient's Cardiovascular Status Stable, Respiratory Function Stable, Patent Airway and No signs of Nausea or vomiting  Post-op Vital Signs: Reviewed and stable  Complications: No complications documented.

## 2019-08-23 NOTE — Op Note (Signed)
PREOPERATIVE DIAGNOSIS:  Nuclear sclerotic cataract of the right eye.   POSTOPERATIVE DIAGNOSIS:  H25.11 Cataract   OPERATIVE PROCEDURE:@   SURGEON:  Galen Manila, MD.   ANESTHESIA:  Anesthesiologist: Edwyna Ready, MD CRNA: Michaele Offer, CRNA  1.      Managed anesthesia care. 2.      0.21ml of Shugarcaine was instilled in the eye following the paracentesis.   COMPLICATIONS:  None.   TECHNIQUE:   Stop and chop   DESCRIPTION OF PROCEDURE:  The patient was examined and consented in the preoperative holding area where the aforementioned topical anesthesia was applied to the right eye and then brought back to the Operating Room where the right eye was prepped and draped in the usual sterile ophthalmic fashion and a lid speculum was placed. A paracentesis was created with the side port blade and the anterior chamber was filled with viscoelastic. A near clear corneal incision was performed with the steel keratome. A continuous curvilinear capsulorrhexis was performed with a cystotome followed by the capsulorrhexis forceps. Hydrodissection and hydrodelineation were carried out with BSS on a blunt cannula. The lens was removed in a stop and chop  technique and the remaining cortical material was removed with the irrigation-aspiration handpiece. The capsular bag was inflated with viscoelastic and the Technis [id number]  lens was placed in the capsular bag without complication. The remaining viscoelastic was removed from the eye with the irrigation-aspiration handpiece. The wounds were hydrated. The anterior chamber was flushed with BSS and the eye was inflated to physiologic pressure. 0.26ml of Vigamox was placed in the anterior chamber. The wounds were found to be water tight. The eye was dressed with Combigan. The patient was given protective glasses to wear throughout the day and a shield with which to sleep tonight. The patient was also given drops with which to begin a drop regimen today and will  follow-up with me in one day. Implant Name Type Inv. Item Serial No. Manufacturer Lot No. LRB No. Used Action  LENS IOL DIOP 22.0 - [id number] Intraocular Lens LENS IOL DIOP 22.0 [id number] [employer]  Right 1 Implanted   Procedure(s): CATARACT EXTRACTION PHACO AND INTRAOCULAR LENS PLACEMENT (IOC) RIGHT 17.09 01:20.3 (Right)  Electronically signed: Galen Manila 08/23/2019 10:12 AM

## 2019-08-23 NOTE — Anesthesia Postprocedure Evaluation (Signed)
Anesthesia Post Note  Patient: Jeanne Stewart  Procedure(s) Performed: CATARACT EXTRACTION PHACO AND INTRAOCULAR LENS PLACEMENT (IOC) RIGHT 17.09 01:20.3 (Right Eye)     Patient location during evaluation: PACU Anesthesia Type: MAC Level of consciousness: awake and alert Pain management: pain level controlled Vital Signs Assessment: post-procedure vital signs reviewed and stable Respiratory status: spontaneous breathing, nonlabored ventilation, respiratory function stable and patient connected to nasal cannula oxygen Cardiovascular status: stable and blood pressure returned to baseline Postop Assessment: no apparent nausea or vomiting Anesthetic complications: no   No complications documented.  Sinda Du

## 2019-08-23 NOTE — H&P (Signed)
All labs reviewed. Abnormal studies sent to patients PCP when indicated.  Previous H&P reviewed, patient examined, there are NO CHANGES.  Jeanne Nichelson Porfilio8/17/20219:48 AM

## 2019-08-23 NOTE — Anesthesia Preprocedure Evaluation (Addendum)
Anesthesia Evaluation  Patient identified by MRN, date of birth, ID band Patient awake    Reviewed: Allergy & Precautions, NPO status , Patient's Chart, lab work & pertinent test results  History of Anesthesia Complications Negative for: history of anesthetic complications  Airway Mallampati: II  TM Distance: >3 FB Neck ROM: Full    Dental no notable dental hx.    Pulmonary asthma ,   Has not filled her prescription for inhalers so she could not take them today. Usually goes to the pharmacy when she needs one. Will give nebulizer now. Saturating 93-97% on room air.   + wheezing      Cardiovascular Exercise Tolerance: Good hypertension, Normal cardiovascular exam Rhythm:Regular Rate:Normal  HLD   Neuro/Psych negative neurological ROS  negative psych ROS   GI/Hepatic negative GI ROS, Neg liver ROS,   Endo/Other  negative endocrine ROS  Renal/GU negative Renal ROS  negative genitourinary   Musculoskeletal negative musculoskeletal ROS (+)   Abdominal (+) + obese,  Abdomen: soft.    Peds negative pediatric ROS (+)  Hematology negative hematology ROS (+)   Anesthesia Other Findings   Reproductive/Obstetrics negative OB ROS                            Anesthesia Physical Anesthesia Plan  ASA: III  Anesthesia Plan: MAC   Post-op Pain Management:    Induction: Intravenous  PONV Risk Score and Plan: 2 and Treatment may vary due to age or medical condition  Airway Management Planned: Nasal Cannula  Additional Equipment:   Intra-op Plan:   Post-operative Plan:   Informed Consent: I have reviewed the patients History and Physical, chart, labs and discussed the procedure including the risks, benefits and alternatives for the proposed anesthesia with the patient or authorized representative who has indicated his/her understanding and acceptance.     Dental advisory given  Plan  Discussed with: CRNA  Anesthesia Plan Comments:         Anesthesia Quick Evaluation  Patient Active Problem List   Diagnosis Date Noted  . Obesity 08/23/2019  . Essential hypertension 08/23/2019    CBC Latest Ref Rng & Units 01/06/2018 09/23/2017 10/24/2016  WBC 4.0 - 10.5 K/uL 7.1 8.2 6.7  Hemoglobin 12.0 - 15.0 g/dL 12.5 13.3 13.9  Hematocrit 36 - 46 % 37.7 38.5 40.6  Platelets 150 - 400 K/uL 216 270 287   BMP Latest Ref Rng & Units 01/06/2018 09/23/2017 10/24/2016  Glucose 70 - 99 mg/dL 118(H) 136(H) 107(H)  BUN 8 - 23 mg/dL _0 Creatinine 0.44 - 1.00 mg/dL 0.64 0.70 1.06(H)  Sodium 135 - 145 mmol/L 131(L) 135 131(L)  Potassium 3.5 - 5.1 mmol/L 3.2(L) 4.1 4.2  Chloride 98 - 111 mmol/L 94(L) 94(L) 93(L)  CO2 22 - 32 mmol/L _1 Calcium 8.9 - 10.3 mg/dL 8.8(L) 10.2 9.6    Risks and benefits of anesthesia discussed at length, patient or surrogate demonstrates understanding. Appropriately NPO. Plan to proceed with anesthesia.  Champ Mungo, MD 08/23/19

## 2019-08-24 ENCOUNTER — Encounter: Payer: Self-pay | Admitting: Ophthalmology

## 2019-09-08 NOTE — Discharge Instructions (Signed)

## 2019-09-09 ENCOUNTER — Other Ambulatory Visit: Admission: RE | Admit: 2019-09-09 | Payer: Medicare HMO | Source: Ambulatory Visit

## 2019-09-23 ENCOUNTER — Other Ambulatory Visit
Admission: RE | Admit: 2019-09-23 | Discharge: 2019-09-23 | Disposition: A | Discharge status: Home / Self Care | Payer: Medicare HMO | Source: Ambulatory Visit | Attending: Ophthalmology | Admitting: Ophthalmology

## 2019-09-23 ENCOUNTER — Other Ambulatory Visit: Payer: Self-pay

## 2019-09-23 DIAGNOSIS — Z01812 Encounter for preprocedural laboratory examination: Secondary | ICD-10-CM | POA: Diagnosis present

## 2019-09-23 DIAGNOSIS — Z20822 Contact with and (suspected) exposure to covid-19: Secondary | ICD-10-CM | POA: Insufficient documentation

## 2019-09-24 LAB — SARS CORONAVIRUS 2 (TAT 6-24 HRS): SARS Coronavirus 2: NEGATIVE

## 2019-09-27 ENCOUNTER — Ambulatory Visit
Admission: RE | Admit: 2019-09-27 | Discharge: 2019-09-27 | Disposition: A | Discharge status: Home / Self Care | Payer: Medicare HMO | Attending: Ophthalmology | Admitting: Ophthalmology

## 2019-09-27 ENCOUNTER — Other Ambulatory Visit: Payer: Self-pay

## 2019-09-27 ENCOUNTER — Ambulatory Visit: Payer: Medicare HMO | Admitting: Anesthesiology

## 2019-09-27 ENCOUNTER — Encounter
Admission: RE | Disposition: A | Discharge status: Home / Self Care | Payer: Self-pay | Source: Home / Self Care | Attending: Ophthalmology

## 2019-09-27 ENCOUNTER — Encounter: Payer: Self-pay | Admitting: Ophthalmology

## 2019-09-27 DIAGNOSIS — H2512 Age-related nuclear cataract, left eye: Secondary | ICD-10-CM | POA: Diagnosis present

## 2019-09-27 DIAGNOSIS — E7800 Pure hypercholesterolemia, unspecified: Secondary | ICD-10-CM | POA: Insufficient documentation

## 2019-09-27 DIAGNOSIS — Z79899 Other long term (current) drug therapy: Secondary | ICD-10-CM | POA: Insufficient documentation

## 2019-09-27 DIAGNOSIS — I10 Essential (primary) hypertension: Secondary | ICD-10-CM | POA: Diagnosis not present

## 2019-09-27 DIAGNOSIS — J45909 Unspecified asthma, uncomplicated: Secondary | ICD-10-CM | POA: Insufficient documentation

## 2019-09-27 HISTORY — PX: CATARACT EXTRACTION W/PHACO: SHX586

## 2019-09-27 SURGERY — PHACOEMULSIFICATION, CATARACT, WITH IOL INSERTION
Anesthesia: Monitor Anesthesia Care | Site: Eye | Laterality: Left

## 2019-09-27 MED ORDER — ARMC OPHTHALMIC DILATING DROPS
1.0000 "application " | OPHTHALMIC | Status: DC | PRN
  Administered 2019-09-27 (×3): 1 via OPHTHALMIC

## 2019-09-27 MED ORDER — MOXIFLOXACIN HCL 0.5 % OP SOLN
OPHTHALMIC | Status: DC | PRN
  Administered 2019-09-27: 0.2 mL via OPHTHALMIC

## 2019-09-27 MED ORDER — MIDAZOLAM HCL 2 MG/2ML IJ SOLN
INTRAMUSCULAR | Status: DC | PRN
  Administered 2019-09-27: .5 mg via INTRAVENOUS

## 2019-09-27 MED ORDER — EPINEPHRINE PF 1 MG/ML IJ SOLN
INTRAOCULAR | Status: DC | PRN
  Administered 2019-09-27: 64 mL via OPHTHALMIC

## 2019-09-27 MED ORDER — TETRACAINE HCL 0.5 % OP SOLN
1.0000 [drp] | OPHTHALMIC | Status: DC | PRN
  Administered 2019-09-27 (×3): 1 [drp] via OPHTHALMIC

## 2019-09-27 MED ORDER — BRIMONIDINE TARTRATE-TIMOLOL 0.2-0.5 % OP SOLN
OPHTHALMIC | Status: DC | PRN
  Administered 2019-09-27: 1 [drp] via OPHTHALMIC

## 2019-09-27 MED ORDER — LIDOCAINE HCL (PF) 2 % IJ SOLN
INTRAOCULAR | Status: DC | PRN
  Administered 2019-09-27: 2 mL

## 2019-09-27 MED ORDER — FENTANYL CITRATE (PF) 100 MCG/2ML IJ SOLN
INTRAMUSCULAR | Status: DC | PRN
  Administered 2019-09-27: 50 ug via INTRAVENOUS

## 2019-09-27 MED ORDER — NA CHONDROIT SULF-NA HYALURON 40-17 MG/ML IO SOLN
INTRAOCULAR | Status: DC | PRN
  Administered 2019-09-27: 1 mL via INTRAOCULAR

## 2019-09-27 SURGICAL SUPPLY — 20 items
CANNULA ANT/CHMB 27G (MISCELLANEOUS) ×2 IMPLANT
CANNULA ANT/CHMB 27GA (MISCELLANEOUS) ×6 IMPLANT
GLOVE SURG LX 8.0 MICRO (GLOVE) ×2
GLOVE SURG LX STRL 8.0 MICRO (GLOVE) ×1 IMPLANT
GLOVE SURG TRIUMPH 8.0 PF LTX (GLOVE) ×3 IMPLANT
GOWN STRL REUS W/ TWL LRG LVL3 (GOWN DISPOSABLE) ×2 IMPLANT
GOWN STRL REUS W/TWL LRG LVL3 (GOWN DISPOSABLE) ×6
LENS IOL DIOP 20.5 (Intraocular Lens) ×3 IMPLANT
LENS IOL TECNIS MONO 20.5 (Intraocular Lens) IMPLANT
MARKER SKIN DUAL TIP RULER LAB (MISCELLANEOUS) ×3 IMPLANT
NDL FILTER BLUNT 18X1 1/2 (NEEDLE) ×1 IMPLANT
NEEDLE FILTER BLUNT 18X 1/2SAF (NEEDLE) ×2
NEEDLE FILTER BLUNT 18X1 1/2 (NEEDLE) ×1 IMPLANT
PACK EYE AFTER SURG (MISCELLANEOUS) ×3 IMPLANT
PACK OPTHALMIC (MISCELLANEOUS) ×3 IMPLANT
PACK PORFILIO (MISCELLANEOUS) ×3 IMPLANT
SYR 3ML LL SCALE MARK (SYRINGE) ×3 IMPLANT
SYR TB 1ML LUER SLIP (SYRINGE) ×3 IMPLANT
WATER STERILE IRR 250ML POUR (IV SOLUTION) ×3 IMPLANT
WIPE NON LINTING 3.25X3.25 (MISCELLANEOUS) ×3 IMPLANT

## 2019-09-27 NOTE — Anesthesia Preprocedure Evaluation (Signed)
Anesthesia Evaluation  Patient identified by MRN, date of birth, ID band  History of Anesthesia Complications Negative for: history of anesthetic complications  Airway Mallampati: III  TM Distance: >3 FB Neck ROM: Full    Dental  (+) Upper Dentures, Lower Dentures   Pulmonary asthma ,    Pulmonary exam normal        Cardiovascular Exercise Tolerance: Good hypertension, Pt. on medications Normal cardiovascular exam     Neuro/Psych    GI/Hepatic negative GI ROS, Neg liver ROS,   Endo/Other  negative endocrine ROS  Renal/GU negative Renal ROS     Musculoskeletal   Abdominal   Peds  Hematology negative hematology ROS (+)   Anesthesia Other Findings   Reproductive/Obstetrics                             Anesthesia Physical Anesthesia Plan  ASA: II  Anesthesia Plan: MAC   Post-op Pain Management:    Induction:   PONV Risk Score and Plan: 1 and Midazolam, TIVA and Treatment may vary due to age or medical condition  Airway Management Planned: Nasal Cannula and Natural Airway  Additional Equipment: None  Intra-op Plan:   Post-operative Plan:   Informed Consent: I have reviewed the patients History and Physical, chart, labs and discussed the procedure including the risks, benefits and alternatives for the proposed anesthesia with the patient or authorized representative who has indicated his/her understanding and acceptance.       Plan Discussed with: CRNA  Anesthesia Plan Comments:         Anesthesia Quick Evaluation

## 2019-09-27 NOTE — H&P (Signed)
All labs reviewed. Abnormal studies sent to patients PCP when indicated.  Previous H&P reviewed, patient examined, there are NO CHANGES.  Shellie Rogoff Porfilio9/21/202110:19 AM

## 2019-09-27 NOTE — Anesthesia Procedure Notes (Signed)
Procedure Name: MAC Date/Time: 09/27/2019 10:28 AM Performed by: Silvana Newness, CRNA Pre-anesthesia Checklist: Patient identified, Emergency Drugs available, Suction available, Patient being monitored and Timeout performed Patient Re-evaluated:Patient Re-evaluated prior to induction Oxygen Delivery Method: Nasal cannula Placement Confirmation: positive ETCO2

## 2019-09-27 NOTE — Transfer of Care (Signed)
Immediate Anesthesia Transfer of Care Note  Patient: Jeanne Stewart  Procedure(s) Performed: CATARACT EXTRACTION PHACO AND INTRAOCULAR LENS PLACEMENT (IOC) LEFT DIABETIC 14.68 01:13.2 (Left Eye)  Patient Location: PACU  Anesthesia Type: MAC  Level of Consciousness: awake, alert  and patient cooperative  Airway and Oxygen Therapy: Patient Spontanous Breathing and Patient connected to supplemental oxygen  Post-op Assessment: Post-op Vital signs reviewed, Patient's Cardiovascular Status Stable, Respiratory Function Stable, Patent Airway and No signs of Nausea or vomiting  Post-op Vital Signs: Reviewed and stable  Complications: No complications documented.

## 2019-09-27 NOTE — Anesthesia Postprocedure Evaluation (Signed)
Anesthesia Post Note  Patient: Jeanne Stewart  Procedure(s) Performed: CATARACT EXTRACTION PHACO AND INTRAOCULAR LENS PLACEMENT (IOC) LEFT DIABETIC 14.68 01:13.2 (Left Eye)     Patient location during evaluation: PACU Anesthesia Type: MAC Level of consciousness: awake and alert Pain management: pain level controlled Vital Signs Assessment: post-procedure vital signs reviewed and stable Respiratory status: spontaneous breathing Cardiovascular status: blood pressure returned to baseline Postop Assessment: no apparent nausea or vomiting, adequate PO intake and no headache Anesthetic complications: no   No complications documented.  Adele Barthel Geraldyn Shain

## 2019-09-27 NOTE — Op Note (Signed)
PREOPERATIVE DIAGNOSIS:  Nuclear sclerotic cataract of the left eye.   POSTOPERATIVE DIAGNOSIS:  Nuclear sclerotic cataract of the left eye.   OPERATIVE PROCEDURE:@   SURGEON:  Galen Manila, MD.   ANESTHESIA:  Anesthesiologist: Page, Wille Celeste, MD CRNA: Michaele Offer, CRNA; Maree Krabbe, CRNA  1.      Managed anesthesia care. 2.     0.24ml of Shugarcaine was instilled following the paracentesis   COMPLICATIONS:  None.   TECHNIQUE:   Stop and chop   DESCRIPTION OF PROCEDURE:  The patient was examined and consented in the preoperative holding area where the aforementioned topical anesthesia was applied to the left eye and then brought back to the Operating Room where the left eye was prepped and draped in the usual sterile ophthalmic fashion and a lid speculum was placed. A paracentesis was created with the side port blade and the anterior chamber was filled with viscoelastic. A near clear corneal incision was performed with the steel keratome. A continuous curvilinear capsulorrhexis was performed with a cystotome followed by the capsulorrhexis forceps. Hydrodissection and hydrodelineation were carried out with BSS on a blunt cannula. The lens was removed in a stop and chop  technique and the remaining cortical material was removed with the irrigation-aspiration handpiece. The capsular bag was inflated with viscoelastic and the Technis [id number] lens was placed in the capsular bag without complication. The remaining viscoelastic was removed from the eye with the irrigation-aspiration handpiece. The wounds were hydrated. The anterior chamber was flushed with BSS and the eye was inflated to physiologic pressure. 0.86ml Vigamox was placed in the anterior chamber. The wounds were found to be water tight. The eye was dressed with Combigan. The patient was given protective glasses to wear throughout the day and a shield with which to sleep tonight. The patient was also given drops with which to begin a  drop regimen today and will follow-up with me in one day. Implant Name Type Inv. Item Serial No. Manufacturer Lot No. LRB No. Used Action  LENS IOL DIOP 20.5 - [id number] Intraocular Lens LENS IOL DIOP 20.5 [id number] [id number]   Left 1 Implanted    Procedure(s): CATARACT EXTRACTION PHACO AND INTRAOCULAR LENS PLACEMENT (IOC) LEFT DIABETIC 14.68 01:13.2 (Left)  Electronically signed: Galen Manila 09/27/2019 10:44 AM

## 2019-09-28 ENCOUNTER — Encounter: Payer: Self-pay | Admitting: Ophthalmology

## 2021-02-14 ENCOUNTER — Ambulatory Visit: Payer: Self-pay | Admitting: Adult Health

## 2021-02-14 ENCOUNTER — Encounter: Payer: Self-pay | Admitting: Adult Health

## 2021-02-14 ENCOUNTER — Other Ambulatory Visit: Payer: Self-pay

## 2021-02-14 DIAGNOSIS — F489 Nonpsychotic mental disorder, unspecified: Secondary | ICD-10-CM

## 2021-02-14 NOTE — Progress Notes (Signed)
Patient referred to neurology for evaluation.

## 2021-02-21 ENCOUNTER — Telehealth: Payer: Self-pay | Admitting: Adult Health

## 2021-02-21 NOTE — Telephone Encounter (Signed)
Pt's daughter LVM and said at her mother's appt with Almira Coaster last week, Almira Coaster said the mother needed to be referred to a neurologist.  Daughter doesn't know if Almira Coaster sent a referral to someone already or if they are supposed to find one on their own.  She just doesn't want to start looking if Almira Coaster has reached out to someone.  No upcoming appt scheduled.

## 2021-02-21 NOTE — Telephone Encounter (Signed)
Please see phone message. I don't see a referral in Epic. Is this something you will handle?

## 2021-02-26 NOTE — Telephone Encounter (Signed)
There are no notes. She saw Almira Coaster, but she didn't do a note/charge because appt was not appropriate.

## 2021-02-26 NOTE — Telephone Encounter (Signed)
Jeanne Stewart at [facility] LVM.  She ask if there were any office notes referencing the symptoms of dementia.  If not, she will need to be seen by a PCP before she can be seen by them.  No upcoming appts scheduled.  Jeanne Stewart's number is [phone]

## 2021-03-06 ENCOUNTER — Telehealth: Payer: Self-pay | Admitting: Adult Health

## 2021-03-06 NOTE — Telephone Encounter (Signed)
Pt had NPA w/ RM 01/25/21. Provider did not charge for appt. Pt needed to see Neuro for evaluation. We referred to [facility] 02/25/21. They needed more info. I faxed the referral from PCP to [facility] today 03/06/21. Also, talked to referral coordinator  to advise faxing more info. ?

## 2021-04-12 ENCOUNTER — Other Ambulatory Visit: Payer: Self-pay | Admitting: Neurology

## 2021-04-12 DIAGNOSIS — R413 Other amnesia: Secondary | ICD-10-CM

## 2021-04-22 ENCOUNTER — Ambulatory Visit
Admission: RE | Admit: 2021-04-22 | Discharge: 2021-04-22 | Disposition: A | Discharge status: Home / Self Care | Payer: Medicare HMO | Source: Ambulatory Visit | Attending: Neurology | Admitting: Neurology

## 2021-04-22 DIAGNOSIS — R413 Other amnesia: Secondary | ICD-10-CM

## 2021-05-23 ENCOUNTER — Other Ambulatory Visit: Payer: Self-pay | Admitting: Internal Medicine

## 2021-05-23 ENCOUNTER — Ambulatory Visit
Admission: RE | Admit: 2021-05-23 | Discharge: 2021-05-23 | Disposition: A | Discharge status: Home / Self Care | Payer: Medicare HMO | Source: Ambulatory Visit | Attending: Internal Medicine | Admitting: Internal Medicine

## 2021-05-23 DIAGNOSIS — W010XXA Fall on same level from slipping, tripping and stumbling without subsequent striking against object, initial encounter: Secondary | ICD-10-CM | POA: Insufficient documentation

## 2021-05-23 DIAGNOSIS — G3189 Other specified degenerative diseases of nervous system: Secondary | ICD-10-CM | POA: Diagnosis not present

## 2021-05-23 DIAGNOSIS — I6782 Cerebral ischemia: Secondary | ICD-10-CM | POA: Insufficient documentation

## 2021-05-23 DIAGNOSIS — S0083XA Contusion of other part of head, initial encounter: Secondary | ICD-10-CM | POA: Diagnosis present

## 2021-11-20 ENCOUNTER — Emergency Department
Admission: EM | Admit: 2021-11-20 | Discharge: 2021-11-20 | Disposition: A | Discharge status: Home / Self Care | Payer: Medicare HMO | Attending: Emergency Medicine | Admitting: Emergency Medicine

## 2021-11-20 ENCOUNTER — Other Ambulatory Visit: Payer: Self-pay

## 2021-11-20 ENCOUNTER — Emergency Department: Payer: Medicare HMO

## 2021-11-20 ENCOUNTER — Encounter: Payer: Self-pay | Admitting: Radiology

## 2021-11-20 DIAGNOSIS — R197 Diarrhea, unspecified: Secondary | ICD-10-CM | POA: Diagnosis present

## 2021-11-20 DIAGNOSIS — K529 Noninfective gastroenteritis and colitis, unspecified: Secondary | ICD-10-CM | POA: Diagnosis not present

## 2021-11-20 LAB — COMPREHENSIVE METABOLIC PANEL
ALT: 16 U/L (ref 0–44)
AST: 31 U/L (ref 15–41)
Albumin: 3 g/dL — ABNORMAL LOW (ref 3.5–5.0)
Alkaline Phosphatase: 71 U/L (ref 38–126)
Anion gap: 13 (ref 5–15)
BUN: 30 mg/dL — ABNORMAL HIGH (ref 8–23)
CO2: 28 mmol/L (ref 22–32)
Calcium: 9.1 mg/dL (ref 8.9–10.3)
Chloride: 100 mmol/L (ref 98–111)
Creatinine, Ser: 1.15 mg/dL — ABNORMAL HIGH (ref 0.44–1.00)
GFR, Estimated: 48 mL/min — ABNORMAL LOW (ref >60)
Glucose, Bld: 106 mg/dL — ABNORMAL HIGH (ref 70–99)
Potassium: 3.1 mmol/L — ABNORMAL LOW (ref 3.5–5.1)
Sodium: 141 mmol/L (ref 135–145)
Total Bilirubin: 0.6 mg/dL (ref 0.3–1.2)
Total Protein: 6.5 g/dL (ref 6.5–8.1)

## 2021-11-20 LAB — CBC
HCT: 43.2 % (ref 36.0–46.0)
Hemoglobin: 14 g/dL (ref 12.0–15.0)
MCH: 29.4 pg (ref 26.0–34.0)
MCHC: 32.4 g/dL (ref 30.0–36.0)
MCV: 90.6 fL (ref 80.0–100.0)
Platelets: 278 10*3/uL (ref 150–400)
RBC: 4.77 MIL/uL (ref 3.87–5.11)
RDW: 12.7 % (ref 11.5–15.5)
WBC: 6.6 10*3/uL (ref 4.0–10.5)
nRBC: 0 % (ref 0.0–0.2)

## 2021-11-20 LAB — LIPASE, BLOOD: Lipase: 29 U/L (ref 11–51)

## 2021-11-20 MED ORDER — METRONIDAZOLE 500 MG PO TABS: 500.0000 mg | ORAL_TABLET | Freq: Three times a day (TID) | ORAL | 0 refills | Status: AC

## 2021-11-20 MED ORDER — IOHEXOL 300 MG/ML  SOLN
75.0000 mL | Freq: Once | INTRAMUSCULAR | Status: AC | PRN
  Administered 2021-11-20: 75 mL via INTRAVENOUS

## 2021-11-20 MED ORDER — CIPROFLOXACIN HCL 500 MG PO TABS: 500.0000 mg | ORAL_TABLET | Freq: Two times a day (BID) | ORAL | 0 refills | Status: AC

## 2021-11-20 MED ORDER — SODIUM CHLORIDE 0.9 % IV BOLUS
1000.0000 mL | Freq: Once | INTRAVENOUS | Status: AC
  Administered 2021-11-20: 1000 mL via INTRAVENOUS

## 2021-11-20 NOTE — ED Provider Notes (Signed)
[facility] Provider Note    Event Date/Time   First MD Initiated Contact with Patient 11/20/21 1137     (approximate)   History   Diarrhea   HPI  Jeanne Stewart is a 81 y.o. female with history of 7 days of diarrhea with a couple of instances of vomiting.  She was sent here from [facility] clinic for concern of dehydration.  Patient denies abdominal pain or cramping at this time.  She has not had any vomiting over the past 24 hours. No known fever.     Physical Exam   Triage Vital Signs: ED Triage Vitals  Enc Vitals Group     BP 11/20/21 1134 (!) 94/57     Pulse Rate 11/20/21 1134 84     Resp 11/20/21 1134 15     Temp --      Temp Source 11/20/21 1134 Oral     SpO2 11/20/21 1134 93 %     Weight 11/20/21 1133 162 lb 14.7 oz (73.9 kg)     Height 11/20/21 1133 4\' 11"  (1.499 m)     Head Circumference --      Peak Flow --      Pain Score 11/20/21 1133 0     Pain Loc --      Pain Edu? --      Excl. in GC? --     Most recent vital signs: Vitals:   11/20/21 1230 11/20/21 1651  BP: (!) 111/59 (!) 134/109  Pulse: 77 79  Resp:  18  SpO2: 93% 93%     General: Awake, no distress.  CV:  Good peripheral perfusion.  Resp:  Normal effort.  Abd:  No distention.  No focal tenderness on exam Other:     ED Results / Procedures / Treatments   Labs (all labs ordered are listed, but only abnormal results are displayed) Labs Reviewed  COMPREHENSIVE METABOLIC PANEL - Abnormal; Notable for the following components:      Result Value   Potassium 3.1 (*)    Glucose, Bld 106 (*)    BUN 30 (*)    Creatinine, Ser 1.15 (*)    Albumin 3.0 (*)    GFR, Estimated 48 (*)    All other components within normal limits  GASTROINTESTINAL PANEL BY PCR, STOOL (REPLACES STOOL CULTURE)  C DIFFICILE QUICK SCREEN W PCR REFLEX    LIPASE, BLOOD  CBC  URINALYSIS, ROUTINE W REFLEX MICROSCOPIC     EKG  Not indicated   RADIOLOGY  CT of the abdomen and pelvis  with IV and oral contrast shows mild diffuse colonic wall thickening concerning for infectious or inflammatory colitis as well as a moderate-sized sliding-type hiatal hernia.   PROCEDURES:  Critical Care performed: No  Procedures   MEDICATIONS ORDERED IN ED: Medications  sodium chloride 0.9 % bolus 1,000 mL (0 mLs Intravenous Stopped 11/20/21 1651)  iohexol (OMNIPAQUE) 300 MG/ML solution 75 mL (75 mLs Intravenous Contrast Given 11/20/21 1440)     IMPRESSION / MDM / ASSESSMENT AND PLAN / ED COURSE  I reviewed the triage vital signs and the nursing notes.                              Differential diagnosis includes, but is not limited to, colitis, gastritis, bowel obstruction, irritable bowel syndrome, C. Difficile.  Patient's presentation is most consistent with acute illness / injury with system symptoms.  81 year old  female presenting to the emergency department for treatment and evaluation of 1 week of diarrhea and a few episodes of vomiting sent here from [facility] clinic for evaluation of dehydration.  Plan will be to get labs, give IV fluids and a CT of the abdomen and pelvis.  Patient has been unable to provide a urine specimen.  She has called and use the restroom twice but has not been able to get the urine in the collection bowl that was sat in the toilet.  She denies having symptoms such as frequency, dysuria, or pelvic pain.  She did have a very slight bump in her BUN and creatinine and a dip in her potassium.  IV fluids were ordered and infused.  Her CBC is reassuring and has no leukocytosis.  CT of the abdomen and pelvis shows inflammatory versus infectious colitis.  She did not have a diarrhea episode while here therefore had doubt it is C. difficile.  She has not been on antibiotics recently.  Plan will be to cover her with Cipro and Flagyl and have her follow-up with her primary care provider.  If symptoms change or worsen and she is unable to see her primary care provider,  she is to return to the emergency department.  Patient and family were made aware of all test results and medication plan.     FINAL CLINICAL IMPRESSION(S) / ED DIAGNOSES   Final diagnoses:  Colitis     Rx / DC Orders   ED Discharge Orders          Ordered    ciprofloxacin (CIPRO) 500 MG tablet  2 times daily        11/20/21 1559    metroNIDAZOLE (FLAGYL) 500 MG tablet  3 times daily        11/20/21 1559             Note:  This document was prepared using Dragon voice recognition software and may include unintentional dictation errors.   [name], FNP 11/20/21 1936    Merwyn Katos, MD 11/21/21 [phone]

## 2021-11-20 NOTE — ED Notes (Signed)
Hat placed in toilet, pt did urinate but not into hat. Pt taken back to bed via WC.

## 2021-11-20 NOTE — ED Notes (Signed)
X2 IV attempts by paramedic student, unsuccessful.

## 2021-11-20 NOTE — ED Triage Notes (Signed)
C/o diarrhea x 7 days and intermittent vomiting.  Referred from [facility] for concerns of dehydration.  Patient is aAOx3.  Skin warm and dry. NAD

## 2022-07-31 ENCOUNTER — Other Ambulatory Visit: Payer: Self-pay

## 2022-07-31 ENCOUNTER — Encounter: Payer: Self-pay | Admitting: Emergency Medicine

## 2022-07-31 ENCOUNTER — Emergency Department
Admission: EM | Admit: 2022-07-31 | Discharge: 2022-08-01 | Disposition: A | Discharge status: Home / Self Care | Payer: Medicare HMO | Attending: Emergency Medicine | Admitting: Emergency Medicine

## 2022-07-31 ENCOUNTER — Emergency Department: Payer: Medicare HMO

## 2022-07-31 DIAGNOSIS — K5792 Diverticulitis of intestine, part unspecified, without perforation or abscess without bleeding: Secondary | ICD-10-CM

## 2022-07-31 DIAGNOSIS — E876 Hypokalemia: Secondary | ICD-10-CM | POA: Insufficient documentation

## 2022-07-31 DIAGNOSIS — R1030 Lower abdominal pain, unspecified: Secondary | ICD-10-CM | POA: Diagnosis present

## 2022-07-31 DIAGNOSIS — M545 Low back pain, unspecified: Secondary | ICD-10-CM | POA: Diagnosis not present

## 2022-07-31 DIAGNOSIS — I10 Essential (primary) hypertension: Secondary | ICD-10-CM | POA: Insufficient documentation

## 2022-07-31 DIAGNOSIS — F0390 Unspecified dementia without behavioral disturbance: Secondary | ICD-10-CM | POA: Insufficient documentation

## 2022-07-31 DIAGNOSIS — K5732 Diverticulitis of large intestine without perforation or abscess without bleeding: Secondary | ICD-10-CM | POA: Diagnosis not present

## 2022-07-31 LAB — URINALYSIS, ROUTINE W REFLEX MICROSCOPIC
Bacteria, UA: NONE SEEN
Bilirubin Urine: NEGATIVE
Glucose, UA: NEGATIVE mg/dL
Hgb urine dipstick: NEGATIVE
Ketones, ur: NEGATIVE mg/dL
Nitrite: NEGATIVE
Protein, ur: NEGATIVE mg/dL
Specific Gravity, Urine: 1.01 (ref 1.005–1.030)
pH: 6 (ref 5.0–8.0)

## 2022-07-31 LAB — COMPREHENSIVE METABOLIC PANEL
ALT: 13 U/L (ref 0–44)
AST: 20 U/L (ref 15–41)
Albumin: 3.6 g/dL (ref 3.5–5.0)
Alkaline Phosphatase: 64 U/L (ref 38–126)
Anion gap: 10 (ref 5–15)
BUN: 9 mg/dL (ref 8–23)
CO2: 29 mmol/L (ref 22–32)
Calcium: 8.5 mg/dL — ABNORMAL LOW (ref 8.9–10.3)
Chloride: 103 mmol/L (ref 98–111)
Creatinine, Ser: 0.8 mg/dL (ref 0.44–1.00)
GFR, Estimated: >60 mL/min (ref >60)
Glucose, Bld: 103 mg/dL — ABNORMAL HIGH (ref 70–99)
Potassium: 2.8 mmol/L — ABNORMAL LOW (ref 3.5–5.1)
Sodium: 142 mmol/L (ref 135–145)
Total Bilirubin: 0.5 mg/dL (ref 0.3–1.2)
Total Protein: 6.5 g/dL (ref 6.5–8.1)

## 2022-07-31 LAB — CBC WITH DIFFERENTIAL/PLATELET
Abs Immature Granulocytes: 0.02 10*3/uL (ref 0.00–0.07)
Basophils Absolute: 0 10*3/uL (ref 0.0–0.1)
Basophils Relative: 0 %
Eosinophils Absolute: 0.1 10*3/uL (ref 0.0–0.5)
Eosinophils Relative: 1 %
HCT: 37.6 % (ref 36.0–46.0)
Hemoglobin: 12.6 g/dL (ref 12.0–15.0)
Immature Granulocytes: 0 %
Lymphocytes Relative: 20 %
Lymphs Abs: 1.2 10*3/uL (ref 0.7–4.0)
MCH: 30.7 pg (ref 26.0–34.0)
MCHC: 33.5 g/dL (ref 30.0–36.0)
MCV: 91.7 fL (ref 80.0–100.0)
Monocytes Absolute: 0.5 10*3/uL (ref 0.1–1.0)
Monocytes Relative: 9 %
Neutro Abs: 4.2 10*3/uL (ref 1.7–7.7)
Neutrophils Relative %: 70 %
Platelets: 144 10*3/uL — ABNORMAL LOW (ref 150–400)
RBC: 4.1 MIL/uL (ref 3.87–5.11)
RDW: 13.6 % (ref 11.5–15.5)
WBC: 6.1 10*3/uL (ref 4.0–10.5)
nRBC: 0 % (ref 0.0–0.2)

## 2022-07-31 MED ORDER — IOHEXOL 300 MG/ML  SOLN
100.0000 mL | Freq: Once | INTRAMUSCULAR | Status: AC | PRN
  Administered 2022-08-01: 100 mL via INTRAVENOUS

## 2022-07-31 MED ORDER — POTASSIUM CHLORIDE CRYS ER 20 MEQ PO TBCR
40.0000 meq | EXTENDED_RELEASE_TABLET | Freq: Once | ORAL | Status: AC
  Administered 2022-07-31: 40 meq via ORAL
  Filled 2022-07-31: qty 2

## 2022-07-31 MED ORDER — POTASSIUM CHLORIDE 10 MEQ/100ML IV SOLN
10.0000 meq | INTRAVENOUS | Status: DC
  Administered 2022-07-31: 10 meq via INTRAVENOUS
  Filled 2022-07-31: qty 100

## 2022-07-31 NOTE — ED Triage Notes (Signed)
Patient endorses lower back and pelvic pain.  Patient denies urinary frequency and dysuria.   Patient does have history of dementia.

## 2022-07-31 NOTE — ED Provider Notes (Signed)
[facility] Provider Note    Event Date/Time   First MD Initiated Contact with Patient 07/31/22 2305     (approximate)   History   Back Pain   HPI  Jeanne Stewart is a 82 y.o. female  here with abdominal and lower back pain. History somewhat limited 2/2 dementia. Lives with son at home. She was c/o lower abdominal pain early today with some mild back pain, which is abnormal for her. She has had slight decreased appetite. No fevers, chills. No urinary sx. She has not had any confusion or AMS. No other complaints.        Physical Exam   Triage Vital Signs: ED Triage Vitals  Encounter Vitals Group     BP 07/31/22 1940 (!) 178/87     Systolic BP Percentile --      Diastolic BP Percentile --      Pulse Rate 07/31/22 1940 70     Resp 07/31/22 1940 18     Temp 07/31/22 1940 98.2 F (36.8 C)     Temp Source 07/31/22 1940 Oral     SpO2 07/31/22 1940 94 %     Weight 07/31/22 1941 130 lb (59 kg)     Height 07/31/22 1941 4\' 11"  (1.499 m)     Head Circumference --      Peak Flow --      Pain Score 07/31/22 1941 1     Pain Loc --      Pain Education --      Exclude from Growth Chart --     Most recent vital signs: Vitals:   08/01/22 0045 08/01/22 0109  BP:    Pulse: 73   Resp: (!) 21   Temp:  98.2 F (36.8 C)  SpO2: 93%      General: Awake, no distress.  CV:  Good peripheral perfusion. RRR. Resp:  Normal work of breathing. Lungs CTAB. Abd:  No distention. No significant abdominal TTP, specifically no suprapubic or LLQ TTP. No rebound or guarding. Other:  MMM. Pleasant, well appearing in NAD.   ED Results / Procedures / Treatments   Labs (all labs ordered are listed, but only abnormal results are displayed) Labs Reviewed  CBC WITH DIFFERENTIAL/PLATELET - Abnormal; Notable for the following components:      Result Value   Platelets 144 (*)    All other components within normal limits  COMPREHENSIVE METABOLIC PANEL - Abnormal; Notable  for the following components:   Potassium 2.8 (*)    Glucose, Bld 103 (*)    Calcium 8.5 (*)    All other components within normal limits  URINALYSIS, ROUTINE W REFLEX MICROSCOPIC - Abnormal; Notable for the following components:   Color, Urine YELLOW (*)    APPearance CLEAR (*)    Leukocytes,Ua SMALL (*)    All other components within normal limits  LACTIC ACID, PLASMA  TROPONIN I (HIGH SENSITIVITY)     EKG    RADIOLOGY CXR: Clear CT A/P: Active diverticulitis w/o complication  I also independently reviewed and agree with radiologist interpretations.   PROCEDURES:  Critical Care performed: No   MEDICATIONS ORDERED IN ED: Medications  potassium chloride 10 mEq in 100 mL IVPB (10 mEq Intravenous Not Given 08/01/22 0107)  potassium chloride SA (KLOR-CON M) CR tablet 40 mEq (40 mEq Oral Given 07/31/22 2334)  iohexol (OMNIPAQUE) 300 MG/ML solution 100 mL (100 mLs Intravenous Contrast Given 08/01/22 0008)  piperacillin-tazobactam (ZOSYN) IVPB 3.375 g (0 g  Intravenous Stopped 08/01/22 0126)     IMPRESSION / MDM / ASSESSMENT AND PLAN / ED COURSE  I reviewed the triage vital signs and the nursing notes.                              Differential diagnosis includes, but is not limited to, diverticulitis, enteritis, colitis, proctitis, UTI, stone, lumbar radicular pain  Patient's presentation is most consistent with acute presentation with potential threat to life or bodily function.  The patient is on the cardiac monitor to evaluate for evidence of arrhythmia and/or significant heart rate changes  82 yo F with PMHx mild dementia, HTN, HLD, here with lower abdominal pain. Pt is overall very well appearing in NAD with reassuring exam. Labs show no major leukocytosis, LA is normal. CMP with mild hypoK but o/w unremarkable and this has bee repleted. UA negative. CT A/P obtained, reviewed, and shows acute diverticulitis w/o complication. She has no signs of sepsis. Given IV dose of  zosyn here.  Discussed dx with pt and family, who prefer outpt management which I think is reasonable. Will give abx, good return precautions, and brief course of PO K replacement.   FINAL CLINICAL IMPRESSION(S) / ED DIAGNOSES   Final diagnoses:  Acute diverticulitis  Hypokalemia     Rx / DC Orders   ED Discharge Orders          Ordered    amoxicillin-clavulanate (AUGMENTIN) 875-125 MG tablet  2 times daily        08/01/22 0128    ondansetron (ZOFRAN-ODT) 4 MG disintegrating tablet  Every 8 hours PRN        08/01/22 0128    potassium chloride SA (KLOR-CON M) 20 MEQ tablet  Daily        08/01/22 0128             Note:  This document was prepared using Dragon voice recognition software and may include unintentional dictation errors.   Shaune Pollack, MD 08/01/22 [phone]

## 2022-08-01 LAB — LACTIC ACID, PLASMA: Lactic Acid, Venous: 1.1 mmol/L (ref 0.5–1.9)

## 2022-08-01 MED ORDER — POTASSIUM CHLORIDE CRYS ER 20 MEQ PO TBCR: 40.0000 meq | EXTENDED_RELEASE_TABLET | Freq: Every day | ORAL | 0 refills | Status: DC

## 2022-08-01 MED ORDER — AMOXICILLIN-POT CLAVULANATE 875-125 MG PO TABS: 1.0000 | ORAL_TABLET | Freq: Two times a day (BID) | ORAL | 0 refills | Status: AC

## 2022-08-01 MED ORDER — PIPERACILLIN-TAZOBACTAM 3.375 G IVPB 30 MIN
3.3750 g | Freq: Once | INTRAVENOUS | Status: AC
  Administered 2022-08-01: 3.375 g via INTRAVENOUS
  Filled 2022-08-01: qty 50

## 2022-08-01 MED ORDER — AMOXICILLIN-POT CLAVULANATE 875-125 MG PO TABS: 1.0000 | ORAL_TABLET | Freq: Two times a day (BID) | ORAL | 0 refills | Status: DC

## 2022-08-01 MED ORDER — ONDANSETRON 4 MG PO TBDP: 4.0000 mg | ORAL_TABLET | Freq: Three times a day (TID) | ORAL | 0 refills | Status: DC | PRN

## 2022-08-01 NOTE — Progress Notes (Signed)
PHARMACY -  BRIEF ANTIBIOTIC NOTE   Pharmacy has received consult(s) for Zosyn from an ED provider.  The patient's profile has been reviewed for ht / wt / allergies / indication / available labs.    One time order(s) placed for: Zosyn 3.375 mg  Further antibiotics/pharmacy consults should be ordered by admitting physician if indicated.                       Thank you, Otelia Sergeant, PharmD, [facility] 08/01/2022 12:51 AM

## 2022-08-09 ENCOUNTER — Emergency Department
Admission: EM | Admit: 2022-08-09 | Discharge: 2022-08-09 | Disposition: A | Discharge status: Home / Self Care | Payer: Medicare HMO | Attending: Emergency Medicine | Admitting: Emergency Medicine

## 2022-08-09 ENCOUNTER — Emergency Department: Payer: Medicare HMO

## 2022-08-09 ENCOUNTER — Encounter: Payer: Self-pay | Admitting: Emergency Medicine

## 2022-08-09 ENCOUNTER — Other Ambulatory Visit: Payer: Self-pay

## 2022-08-09 DIAGNOSIS — Y9301 Activity, walking, marching and hiking: Secondary | ICD-10-CM | POA: Diagnosis not present

## 2022-08-09 DIAGNOSIS — S0003XA Contusion of scalp, initial encounter: Secondary | ICD-10-CM | POA: Diagnosis not present

## 2022-08-09 DIAGNOSIS — G309 Alzheimer's disease, unspecified: Secondary | ICD-10-CM | POA: Insufficient documentation

## 2022-08-09 DIAGNOSIS — S0990XA Unspecified injury of head, initial encounter: Secondary | ICD-10-CM | POA: Diagnosis present

## 2022-08-09 DIAGNOSIS — F0390 Unspecified dementia without behavioral disturbance: Secondary | ICD-10-CM | POA: Diagnosis not present

## 2022-08-09 DIAGNOSIS — W19XXXA Unspecified fall, initial encounter: Secondary | ICD-10-CM

## 2022-08-09 NOTE — ED Notes (Signed)
Went over d/c paperwork at this time with patient and family. Pt/family had no questions, comments or concerns after review and verbally understood them.

## 2022-08-09 NOTE — ED Provider Notes (Signed)
[facility] Provider Note    Event Date/Time   First MD Initiated Contact with Patient 08/09/22 [phone]     (approximate)   History   Fall   HPI  Jeanne Stewart is a 82 y.o. female  here with fall. Pt reportedly roamed outside of her house today (h/o dementia, similar behavior) and when they found her, had a contusion to her left parietal scalp. She was otherwise well. She has been on ABX for diverticulitis but has had resolution of her pain, has been eating/drinking normally and has otherwise been very well. She has not been more confused than usual. She tells me she was going ot get something to eat. Denies any other complaints.        Physical Exam   Triage Vital Signs: ED Triage Vitals  Encounter Vitals Group     BP 08/09/22 0137 (!) 174/78     Systolic BP Percentile --      Diastolic BP Percentile --      Pulse Rate 08/09/22 0137 64     Resp 08/09/22 0137 18     Temp 08/09/22 0137 (!) 97.5 F (36.4 C)     Temp Source 08/09/22 0137 Oral     SpO2 08/09/22 0137 96 %     Weight 08/09/22 0138 130 lb (59 kg)     Height 08/09/22 0138 4\' 11"  (1.499 m)     Head Circumference --      Peak Flow --      Pain Score 08/09/22 0138 0     Pain Loc --      Pain Education --      Exclude from Growth Chart --     Most recent vital signs: Vitals:   08/09/22 0137  BP: (!) 174/78  Pulse: 64  Resp: 18  Temp: (!) 97.5 F (36.4 C)  SpO2: 96%     General: Awake, no distress.  CV:  Good peripheral perfusion.  Resp:  Normal work of breathing.  Abd:  No distention. No tenderness, specifically no lower quadrant TTP, rebound, or guarding. Other:  MMM. Small hematoma to parietal scalp with no crepitance. AOx3 though knows only year. MAE with 5/5 strength. Normal sensation to light touch. At mental baseline.   ED Results / Procedures / Treatments   Labs (all labs ordered are listed, but only abnormal results are displayed) Labs Reviewed - No data to  display   EKG    RADIOLOGY CT Head: NAICA   I also independently reviewed and agree with radiologist interpretations.   PROCEDURES:  Critical Care performed: No   MEDICATIONS ORDERED IN ED: Medications - No data to display   IMPRESSION / MDM / ASSESSMENT AND PLAN / ED COURSE  I reviewed the triage vital signs and the nursing notes.                              Differential diagnosis includes, but is not limited to, mechanical fall, dementia, delirium, contusion, TBI, concussion  Patient's presentation is most consistent with acute presentation with potential threat to life or bodily function.   Very well appearing 82 yo F here with fall while wandering outside home. Behavior is not new for her and family confirms she is at her mental baseline. She had diverticulitis recently but has been recovering well, pain has resolved, having normal BMs and diet. She denies any pain on my assessment and abd  is soft, Nt, ND. CT head reviewed and is negative. Given that she is at her baseline, and family confirms no other recent confusion or signs to suggest delirium/encephalopathy, they would prefer to manage at home with supportive care which I think is reasonable. D/c home.    FINAL CLINICAL IMPRESSION(S) / ED DIAGNOSES   Final diagnoses:  Fall, initial encounter  Contusion of scalp, initial encounter     Rx / DC Orders   ED Discharge Orders     None        Note:  This document was prepared using Dragon voice recognition software and may include unintentional dictation errors.   Shaune Pollack, MD 08/09/22 [phone]
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
## 2022-08-12 ENCOUNTER — Ambulatory Visit (LOCAL_COMMUNITY_HEALTH_CENTER): Payer: Self-pay

## 2022-08-12 DIAGNOSIS — Z111 Encounter for screening for respiratory tuberculosis: Secondary | ICD-10-CM

## 2022-08-15 ENCOUNTER — Ambulatory Visit (LOCAL_COMMUNITY_HEALTH_CENTER): Payer: Self-pay

## 2022-08-15 DIAGNOSIS — Z111 Encounter for screening for respiratory tuberculosis: Secondary | ICD-10-CM

## 2022-09-21 ENCOUNTER — Encounter: Payer: Self-pay | Admitting: Emergency Medicine

## 2022-09-21 ENCOUNTER — Other Ambulatory Visit: Payer: Self-pay

## 2022-09-21 ENCOUNTER — Emergency Department
Admission: EM | Admit: 2022-09-21 | Discharge: 2022-09-21 | Disposition: A | Discharge status: Home / Self Care | Payer: Medicare HMO | Attending: Emergency Medicine | Admitting: Emergency Medicine

## 2022-09-21 DIAGNOSIS — I10 Essential (primary) hypertension: Secondary | ICD-10-CM | POA: Diagnosis not present

## 2022-09-21 DIAGNOSIS — S81801D Unspecified open wound, right lower leg, subsequent encounter: Secondary | ICD-10-CM | POA: Diagnosis present

## 2022-09-21 DIAGNOSIS — F0390 Unspecified dementia without behavioral disturbance: Secondary | ICD-10-CM | POA: Diagnosis not present

## 2022-09-21 DIAGNOSIS — W228XXD Striking against or struck by other objects, subsequent encounter: Secondary | ICD-10-CM | POA: Insufficient documentation

## 2022-09-21 LAB — BASIC METABOLIC PANEL
Anion gap: 11 (ref 5–15)
BUN: 11 mg/dL (ref 8–23)
CO2: 30 mmol/L (ref 22–32)
Calcium: 9 mg/dL (ref 8.9–10.3)
Chloride: 99 mmol/L (ref 98–111)
Creatinine, Ser: 0.8 mg/dL (ref 0.44–1.00)
GFR, Estimated: >60 mL/min (ref >60)
Glucose, Bld: 98 mg/dL (ref 70–99)
Potassium: 3.6 mmol/L (ref 3.5–5.1)
Sodium: 140 mmol/L (ref 135–145)

## 2022-09-21 LAB — URINALYSIS, ROUTINE W REFLEX MICROSCOPIC
Bilirubin Urine: NEGATIVE
Glucose, UA: NEGATIVE mg/dL
Hgb urine dipstick: NEGATIVE
Ketones, ur: NEGATIVE mg/dL
Nitrite: NEGATIVE
Protein, ur: NEGATIVE mg/dL
Specific Gravity, Urine: 1.005 (ref 1.005–1.030)
pH: 6 (ref 5.0–8.0)

## 2022-09-21 LAB — CBC
HCT: 35.7 % — ABNORMAL LOW (ref 36.0–46.0)
Hemoglobin: 11.3 g/dL — ABNORMAL LOW (ref 12.0–15.0)
MCH: 30 pg (ref 26.0–34.0)
MCHC: 31.7 g/dL (ref 30.0–36.0)
MCV: 94.7 fL (ref 80.0–100.0)
Platelets: 241 10*3/uL (ref 150–400)
RBC: 3.77 MIL/uL — ABNORMAL LOW (ref 3.87–5.11)
RDW: 12.8 % (ref 11.5–15.5)
WBC: 4.7 10*3/uL (ref 4.0–10.5)
nRBC: 0 % (ref 0.0–0.2)

## 2022-09-21 MED ORDER — DOXYCYCLINE HYCLATE 100 MG PO CAPS: 100.0000 mg | ORAL_CAPSULE | Freq: Two times a day (BID) | ORAL | 0 refills | Status: DC

## 2022-09-21 MED ORDER — DOXYCYCLINE HYCLATE 100 MG PO CAPS: 100.0000 mg | ORAL_CAPSULE | Freq: Two times a day (BID) | ORAL | 0 refills | Status: AC

## 2022-09-21 NOTE — Discharge Instructions (Addendum)
You were seen in the emergency department today for your leg wound.  This will likely take a while to heal.  I have placed referral to wound care, if you have not heard from them, I have included their contact information above.  I sent a prescription for antibiotics to your pharmacy.  Please take these as directed.  Return to the ER for new or worsening symptoms.

## 2022-09-21 NOTE — ED Triage Notes (Signed)
Pt via POV from home. Pt states that she is here because her daughters state that she needs to be put in a memory care unit. Pt states, "my daughters think I am crazy" pt states that she can take care of herself and states she doesn't know why they would go around telling people that she can't. Pt is alert and oriented to self only. Disoriented to year, location. Pt is ambulatory to triage. Denies any medical complaints.
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
## 2022-10-02 ENCOUNTER — Encounter: Payer: Medicare HMO | Attending: Physician Assistant | Admitting: Physician Assistant

## 2022-10-02 DIAGNOSIS — X58XXXA Exposure to other specified factors, initial encounter: Secondary | ICD-10-CM | POA: Insufficient documentation

## 2022-10-02 DIAGNOSIS — S81811A Laceration without foreign body, right lower leg, initial encounter: Secondary | ICD-10-CM | POA: Diagnosis present

## 2022-10-02 DIAGNOSIS — L97812 Non-pressure chronic ulcer of other part of right lower leg with fat layer exposed: Secondary | ICD-10-CM | POA: Insufficient documentation

## 2022-10-02 DIAGNOSIS — F01A18 Vascular dementia, mild, with other behavioral disturbance: Secondary | ICD-10-CM | POA: Diagnosis not present

## 2022-10-02 DIAGNOSIS — I10 Essential (primary) hypertension: Secondary | ICD-10-CM | POA: Insufficient documentation

## 2022-10-02 NOTE — Progress Notes (Signed)
AMBRE, KUCHARCZYK ([id number]) [phone] [email or web address] Page 1 of 5 Visit Report for 10/02/2022 Abuse Risk Screen Details Patient Name: Date of Service: RHEGAN, CANGEMI. 10/02/2022 8:15 A M Medical Record Number: [id number] Patient Account Number: [id number] Date of Birth/Sex: Treating RN: [birth year] (82 y.o. [name] Primary Care Shannan Garfinkel: [name] Other Clinician: Referring Alyha Marines: Treating Danashia Landers/Extender: Letta Moynahan Weeks in Treatment: 0 Abuse Risk Screen Items Answer ABUSE RISK SCREEN: Has anyone close to you tried to hurt or harm you recentlyo No Do you feel uncomfortable with anyone in your familyo No Has anyone forced you do things that you didnt want to doo No Electronic Signature(s) Signed: 10/02/2022 4:49:27 PM By: Angelina Pih Entered By: Angelina Pih on 10/02/2022 05:38:59 -------------------------------------------------------------------------------- Activities of Daily Living Details Patient Name: Date of Service: MALAN, MORGANO 10/02/2022 8:15 A M Medical Record Number: [id number] Patient Account Number: [id number] Date of Birth/Sex: Treating RN: 09-23-1940 (82 y.o. [name] Primary Care Chey Rachels: [name] Other Clinician: Referring Balraj Brayfield: Treating Kensie Susman/Extender: Letta Moynahan Weeks in Treatment: 0 Activities of Daily Living Items Answer Activities of Daily Living (Please select one for each item) Drive Automobile Not Able T Medications ake Need Assistance Use T elephone Need Assistance Care for Appearance Completely Able Use T oilet Completely Able Bath / Shower Completely Able Dress Self Completely Able Feed Self Completely Able Walk Completely Able Get In / Out Bed Completely Able Housework Need Assistance DELAYLAH, HOLLING ([id number]) [phone] [email or web address] Page 2 of 5 Prepare Meals Need Assistance Handle Money Need Assistance Shop for Self Need  Assistance Electronic Signature(s) Signed: 10/02/2022 4:49:27 PM By: Angelina Pih Entered By: Angelina Pih on 10/02/2022 05:39:44 -------------------------------------------------------------------------------- Education Screening Details Patient Name: Date of Service: [name], [name]. 10/02/2022 8:15 A M Medical Record Number: [id number] Patient Account Number: [id number] Date of Birth/Sex: Treating RN: Nov 12, 1940 (82 y.o. [name] Primary Care Lety Cullens: [name] Other Clinician: Referring Qadir Folks: Treating Austyn Seier/Extender: Sharen Heck in Treatment: 0 Learning Preferences/Education Level/Primary Language Learning Preference: Explanation, Demonstration, Video, Communication Board, Printed Material Preferred Language: English Cognitive Barrier Language Barrier: No Translator Needed: No Memory Deficit: No Emotional Barrier: No Cultural/Religious Beliefs Affecting Medical Care: No Physical Barrier Impaired Vision: No Impaired Hearing: No Decreased Hand dexterity: No Knowledge/Comprehension Knowledge Level: High Comprehension Level: High Ability to understand written instructions: High Ability to understand verbal instructions: High Motivation Anxiety Level: Calm Cooperation: Cooperative Education Importance: Acknowledges Need Interest in Health Problems: Asks Questions Perception: Coherent Willingness to Engage in Self-Management High Activities: Readiness to Engage in Self-Management High Activities: Electronic Signature(s) Signed: 10/02/2022 4:49:27 PM By: Angelina Pih Entered By: Angelina Pih on 10/02/2022 05:40:02 Laveda Abbe ([id number]) [id number] Page 3 of 5 -------------------------------------------------------------------------------- Fall Risk Assessment Details Patient Name: Date of Service: DALERY, HEITKAMP. 10/02/2022 8:15 A M Medical Record Number: [id number] Patient Account  Number: [id number] Date of Birth/Sex: Treating RN: 18-Jun-1940 (82 y.o. [name] Primary Care Madolyn Ackroyd: [name] Other Clinician: Referring Raysa Bosak: Treating Dorwin Fitzhenry/Extender: Letta Moynahan Weeks in Treatment: 0 Fall Risk Assessment Items Have you had 2 or more falls in the last 12 monthso 0 Yes Have you had any fall that resulted in injury in the last 12 monthso 0 No FALLS RISK SCREEN History of falling - immediate or within 3 months 25 Yes Secondary diagnosis (Do you have 2 or more medical diagnoseso) 0 No Ambulatory aid None/bed rest/wheelchair/nurse 0 Yes Crutches/cane/walker 0 No Furniture 0 No Intravenous therapy Access/Saline/Heparin  Lock 0 No Gait/Transferring Normal/ bed rest/ wheelchair 0 Yes Weak (short steps with or without shuffle, stooped but able to lift head while walking, may seek 0 No support from furniture) Impaired (short steps with shuffle, may have difficulty arising from chair, head down, impaired 0 No balance) Mental Status Oriented to own ability 0 Yes Electronic Signature(s) Signed: 10/02/2022 4:49:27 PM By: Angelina Pih Entered By: Angelina Pih on 10/02/2022 05:41:03 -------------------------------------------------------------------------------- Foot Assessment Details Patient Name: Date of Service: [name], [name]. 10/02/2022 8:15 A M Medical Record Number: [id number] Patient Account Number: [id number] Date of Birth/Sex: Treating RN: [birth year] (82 y.o. [name] Primary Care Sheray Grist: [name] Other Clinician: Referring Aison Malveaux: Treating Daylah Sayavong/Extender: Letta Moynahan Weeks in Treatment: 0 Foot Assessment Items Site Locations KLOI, BAZ [place] ([id number]) [phone] [email or web address] Page 4 of 5 + = Sensation present, - = Sensation absent, C = Callus, U = Ulcer R = Redness, W = Warmth, M = Maceration, PU = Pre-ulcerative lesion F = Fissure, S = Swelling, D = Dryness Assessment Right:  Left: Other Deformity: No No Prior Foot Ulcer: No No Prior Amputation: No No Charcot Joint: No No Ambulatory Status: Ambulatory Without Help Gait: Steady Electronic Signature(s) Signed: 10/02/2022 4:49:27 PM By: Angelina Pih Entered By: Angelina Pih on 10/02/2022 05:49:34 -------------------------------------------------------------------------------- Nutrition Risk Screening Details Patient Name: Date of Service: WYLLA, MEDLEY 10/02/2022 8:15 A M Medical Record Number: [id number] Patient Account Number: [id number] Date of Birth/Sex: Treating RN: [birth year] (82 y.o. [name] Primary Care Cayleigh Paull: [name] Other Clinician: Referring Laurene Melendrez: Treating Caliah Kopke/Extender: Letta Moynahan Weeks in Treatment: 0 Height (in): 60 Weight (lbs): 130 Body Mass Index (BMI): 25.4 Nutrition Risk Screening Items Score Screening NUTRITION RISK SCREEN: I have an illness or condition that made me change the kind and/or amount of food I eat 0 No I eat fewer than two meals per day 0 No I eat few fruits and vegetables, or milk products 0 No I have three or more drinks of beer, liquor or wine almost every day 0 No I have tooth or mouth problems that make it hard for me to eat 0 No I don't always have enough money to buy the food I need 0 No OCTAVA, PRANKE ([id number]) [phone] [email or web address] Page 5 of 5 I eat alone most of the time 0 No I take three or more different prescribed or over-the-counter drugs a day 0 No Without wanting to, I have lost or gained 10 pounds in the last six months 0 No I am not always physically able to shop, cook and/or feed myself 0 No Nutrition Protocols Good Risk Protocol 0 No interventions needed Moderate Risk Protocol High Risk Proctocol Risk Level: Good Risk Score: 0 Electronic Signature(s) Signed: 10/02/2022 4:49:27 PM By: Angelina Pih Entered By: Angelina Pih on 10/02/2022 05:41:12

## 2022-10-02 NOTE — Progress Notes (Addendum)
Jeanne Stewart, Jeanne Stewart ([id number]) [id number] Page 1 of 10 Visit Report for 10/02/2022 Chief Complaint Document Details Patient Name: Date of Service: Jeanne Stewart, Jeanne Stewart. 10/02/2022 8:15 A M Medical Record Number: [id number] Patient Account Number: [id number] Date of Birth/Sex: Treating RN: [birth year] (82 y.o. [name] Primary Care Silvestre Mines: [name] Other Clinician: Referring Adasyn Mcadams: Treating Arlington Sigmund/Extender: Augustin Coupe in Treatment: 0 Information Obtained from: Patient Chief Complaint Skin tear right leg Electronic Signature(s) Signed: 10/02/2022 9:08:48 AM By: Allen Derry PA-C Entered By: Allen Derry on 10/02/2022 09:08:48 -------------------------------------------------------------------------------- Debridement Details Patient Name: Date of Service: Jeanne Stewart, Jeanne Stewart. 10/02/2022 8:15 A M Medical Record Number: [id number] Patient Account Number: [id number] Date of Birth/Sex: Treating RN: [birth year] (81 y.o. [name] Primary Care Ellarie Picking: [name] Other Clinician: Referring Makailah Slavick: Treating Tannisha Kennington/Extender: Augustin Coupe in Treatment: 0 Debridement Performed for Assessment: Wound #1 Right Lower Leg Performed By: Physician , Debridement Type: Debridement Level of Consciousness (Pre-procedure): Awake and Alert Pre-procedure Verification/Time Out Yes - 09:12 Taken: Pain Control: Lidocaine 4% T opical Solution Percent of Wound Bed Debrided: 100% T Area Debrided (cm): otal 9.5 Tissue and other material debrided: Viable, Non-Viable, Slough, Subcutaneous, Slough Level: Skin/Subcutaneous Tissue Debridement Description: Excisional Instrument: Curette Bleeding: Moderate Hemostasis Achieved: Pressure Response to Treatment: Procedure was tolerated well Level of Consciousness (Post- Awake and Alert procedure): SOSIE, GATO ([id number]) [id number] Page 2 of 10 Post  Debridement Measurements of Total Wound Length: (cm) 5.5 Width: (cm) 2.2 Depth: (cm) 0.9 Volume: (cm) 8.553 Character of Wound/Ulcer Post Debridement: Stable Post Procedure Diagnosis Same as Pre-procedure Electronic Signature(s) Signed: 10/02/2022 4:49:27 PM By: Angelina Pih Entered By: Angelina Pih on 10/02/2022 09:16:59 -------------------------------------------------------------------------------- HPI Details Patient Name: Date of Service: Jeanne Stewart, Jeanne Stewart. 10/02/2022 8:15 A M Medical Record Number: [id number] Patient Account Number: [id number] Date of Birth/Sex: Treating RN: 1940-12-10 (82 y.o. [name] Primary Care Omelia Marquart: [name] Other Clinician: Referring Khadejah Son: Treating Talani Brazee/Extender: Augustin Coupe in Treatment: 0 History of Present Illness Chronic/Inactive Conditions Condition 1: 10-02-2022 patient's ABI on the office today was 1.31 on the right on screening. HPI Description: 10-02-2022 upon evaluation today patient presents for initial inspection here in our clinic concerning issues that she has been having with a wound over the right anterior lower extremity. This occurred on September 08, 2022. She states this happened on Labor Day she did not go to the ER. This is a laceration which unfortunately has opened further at this point. There does appear to be some need for sharp debridement to clearway necrotic debris. We are definitely can work on this and try to see what we can do about getting this moving in the right direction much more effectively and quickly. Patient does have a history of hypertension along with vascular dementia. Unfortunately she takes most dressings off that are put in place and I think that is going to be the biggest issue here. Electronic Signature(s) Signed: 10/03/2022 1:52:39 PM By: Allen Derry PA-C Entered By: Allen Derry on 10/03/2022  13:52:39 -------------------------------------------------------------------------------- Physical Exam Details Patient Name: Date of Service: Jeanne, Stewart 10/02/2022 8:15 A M Medical Record Number: [id number] Patient Account Number: [id number] Date of Birth/Sex: Treating RN: [birth year] (82 y.o. [name] Primary Care Wenzel Backlund: [name] Other Clinician: Referring Jaquarius Seder: Treating Tanina Barb/Extender: Neda, [place]en[place] ([id number]) [id number] Page 3 of 10 Weeks in Treatment: 0 Constitutional sitting or standing blood pressure is within target range for patient.. pulse regular and within target range for  patient.Marland Kitchen respirations regular, non-labored and within target range for patient.Marland Kitchen temperature within target range for patient.. Well-nourished and well-hydrated in no acute distress. Eyes conjunctiva clear no eyelid edema noted. pupils equal round and reactive to light and accommodation. Ears, Nose, Mouth, and Throat no gross abnormality of ear auricles or external auditory canals. normal hearing noted during conversation. mucus membranes moist. Respiratory normal breathing without difficulty. Cardiovascular 2+ dorsalis pedis/posterior tibialis pulses. 1+ pitting edema of the bilateral lower extremities. Musculoskeletal Patient unable to walk without assistance. no significant deformity or arthritic changes, no loss or range of motion, no clubbing. Psychiatric Patient is not able to cooperate in decision making regarding care. Patient has dementia. patient is confused. Notes Upon inspection patient's wound bed actually showed signs of having some slough and biofilm at this point fortunately she is not really having pain which is good news and I will go ahead and see what I can do about cleaning away the necrotic debris. She tolerated the debridement without complication and postdebridement the wound bed appears to be doing  much better which is great news. Overall I do not see any signs of significant worsening at this time. I do believe she has some swelling and I believe that it would be beneficial for [place] to try to get the swelling under control. Electronic Signature(s) Signed: 10/03/2022 1:53:15 PM By: Allen Derry PA-C Entered By: Allen Derry on 10/03/2022 13:53:15 -------------------------------------------------------------------------------- Physician Orders Details Patient Name: Date of Service: Jeanne Stewart, Jeanne Stewart. 10/02/2022 8:15 A M Medical Record Number: [id number] Patient Account Number: [id number] Date of Birth/Sex: Treating RN: [birth year] (82 y.o. [name] Primary Care Jakalyn Kratky: [name] Other Clinician: Referring Makara Lanzo: Treating Jamica Woodyard/Extender: Augustin Coupe in Treatment: 0 Verbal / Phone Orders: No Diagnosis Coding ICD-10 Coding Code Description [phone] Laceration without foreign body, right lower leg, initial encounter L97.812 Non-pressure chronic ulcer of other part of right lower leg with fat layer exposed I10 Essential (primary) hypertension F01.A18 Vascular dementia, mild, with other behavioral disturbance Follow-up Appointments Return Appointment in 1 week. Nurse Visit as needed - to check first compression wrap Bathing/ Shower/ Hygiene May shower with wound dressing protected with water repellent cover or cast protector. No tub bath. DANAIJA, ESKRIDGE ([id number]) [id number] Page 4 of 10 Anesthetic (Use 'Patient Medications' Section for Anesthetic Order Entry) Lidocaine applied to wound bed Edema Control - Lymphedema / Segmental Compressive Device / Other Elevate, Exercise Daily and A void Standing for Long Periods of Time. Elevate leg(s) parallel to the floor when sitting. DO YOUR BEST to sleep in the bed at night. DO NOT sleep in your recliner. Long hours of sitting in a recliner leads to swelling of the legs and/or  potential wounds on your backside. Wound Treatment Wound #1 - Lower Leg Wound Laterality: Right Cleanser: Soap and Water 2 x Per Week/15 Days Discharge Instructions: Gently cleanse wound with antibacterial soap, rinse and pat dry prior to dressing wounds Cleanser: Wound Cleanser 2 x Per Week/15 Days Discharge Instructions: Wash your hands with soap and water. Remove old dressing, discard into plastic bag and place into trash. Cleanse the wound with Wound Cleanser prior to applying a clean dressing using gauze sponges, not tissues or cotton balls. Do not scrub or use excessive force. Pat dry using gauze sponges, not tissue or cotton balls. Prim Dressing: Hydrofera Blue Ready Transfer Foam, 4x5 (in/in) 2 x Per Week/15 Days ary Discharge Instructions: Apply Hydrofera Blue Ready to wound bed as directed Secondary Dressing: Zetuvit Plus 4x4 (  in/in) 2 x Per Week/15 Days Compression Wrap: Urgo K2 Lite, two layer compression system, regular 2 x Per Week/15 Days Electronic Signature(s) Signed: 10/02/2022 4:49:27 PM By: Angelina Pih Entered By: Angelina Pih on 10/02/2022 09:14:19 -------------------------------------------------------------------------------- Problem List Details Patient Name: Date of Service: Jeanne Stewart, Jeanne Stewart. 10/02/2022 8:15 A M Medical Record Number: [id number] Patient Account Number: [id number] Date of Birth/Sex: Treating RN: 14-Mar-1940 (82 y.o. [name] Primary Care Mckinnley Smithey: [name] Other Clinician: Referring Tyshauna Finkbiner: Treating Aul Mangieri/Extender: Augustin Coupe in Treatment: 0 Active Problems ICD-10 Encounter Code Description Active Date MDM Diagnosis S81.811A Laceration without foreign body, right lower leg, initial encounter 10/02/2022 No Yes L97.812 Non-pressure chronic ulcer of other part of right lower leg with fat layer 10/02/2022 No Yes exposed I10 Essential (primary) hypertension 10/02/2022 No Yes F01.A18 Vascular dementia, mild, with  other behavioral disturbance 10/02/2022 No Yes INFINITI, HOEFLING ([id number]) [id number] Page 5 of 10 Inactive Problems Resolved Problems Electronic Signature(s) Signed: 10/02/2022 9:08:30 AM By: Allen Derry PA-C Entered By: Allen Derry on 10/02/2022 09:08:30 -------------------------------------------------------------------------------- Progress Note Details Patient Name: Date of Service: Jeanne Stewart, Jeanne Stewart. 10/02/2022 8:15 A M Medical Record Number: [id number] Patient Account Number: [id number] Date of Birth/Sex: Treating RN: 29-Nov-1940 (82 y.o. [name] Primary Care James Senn: [name] Other Clinician: Referring Vanesa Renier: Treating Makenlee Mckeag/Extender: Augustin Coupe in Treatment: 0 Subjective Chief Complaint Information obtained from Patient Skin tear right leg History of Present Illness (HPI) Chronic/Inactive Condition: 10-02-2022 patient's ABI on the office today was 1.31 on the right on screening. 10-02-2022 upon evaluation today patient presents for initial inspection here in our clinic concerning issues that she has been having with a wound over the right anterior lower extremity. This occurred on September 08, 2022. She states this happened on Labor Day she did not go to the ER. This is a laceration which unfortunately has opened further at this point. There does appear to be some need for sharp debridement to clearway necrotic debris. We are definitely can work on this and try to see what we can do about getting this moving in the right direction much more effectively and quickly. Patient does have a history of hypertension along with vascular dementia. Unfortunately she takes most dressings off that are put in place and I think that is going to be the biggest issue here. Patient History Unable to Obtain Patient History due to Dementia. Information obtained from Patient, Chart, Other: daughter. Allergies No Known Drug  Allergies Social History Never smoker, Alcohol Use - Never, Drug Use - No History, Caffeine Use - Daily - diet dr pepper. Medical History Respiratory Patient has history of Asthma Cardiovascular Patient has history of Hypertension Neurologic Patient has history of Dementia Review of Systems (ROS) Constitutional Symptoms (General Health) Denies complaints or symptoms of Fatigue, Fever, Chills, Marked Weight Change. Eyes Denies complaints or symptoms of Dry Eyes, Vision Changes, Glasses / Contacts. Ear/Nose/Mouth/Throat Denies complaints or symptoms of Difficult clearing ears, Sinusitis. Hematologic/Lymphatic Denies complaints or symptoms of Bleeding / Clotting Disorders, Human Immunodeficiency Virus. Respiratory ALEXCIA, [employer] ([id number]) [id number] Page 6 of 10 Denies complaints or symptoms of Chronic or frequent coughs, Shortness of Breath. Gastrointestinal Denies complaints or symptoms of Frequent diarrhea, Nausea, Vomiting. Endocrine Denies complaints or symptoms of Hepatitis, Thyroid disease, Polydypsia (Excessive Thirst). Genitourinary Denies complaints or symptoms of Kidney failure/ Dialysis, Incontinence/dribbling. Immunological Denies complaints or symptoms of Hives, Itching. Integumentary (Skin) Denies complaints or symptoms of Wounds, Bleeding or bruising tendency, Breakdown, Swelling. Musculoskeletal Denies complaints  or symptoms of Muscle Pain, Muscle Weakness. Neurologic Denies complaints or symptoms of Numbness/parasthesias, Focal/Weakness. Psychiatric Denies complaints or symptoms of Anxiety, Claustrophobia. Objective Constitutional sitting or standing blood pressure is within target range for patient.. pulse regular and within target range for patient.Marland Kitchen respirations regular, non-labored and within target range for patient.Marland Kitchen temperature within target range for patient.. Well-nourished and well-hydrated in no acute  distress. Vitals Time Taken: 8:34 AM, Height: 60 in, Source: Stated, Weight: 130 lbs, Source: Stated, BMI: 25.4, Temperature: 97.7 F, Pulse: 71 bpm, Respiratory Rate: 18 breaths/min, Blood Pressure: 122/79 mmHg. Eyes conjunctiva clear no eyelid edema noted. pupils equal round and reactive to light and accommodation. Ears, Nose, Mouth, and Throat no gross abnormality of ear auricles or external auditory canals. normal hearing noted during conversation. mucus membranes moist. Respiratory normal breathing without difficulty. Cardiovascular 2+ dorsalis pedis/posterior tibialis pulses. 1+ pitting edema of the bilateral lower extremities. Musculoskeletal Patient unable to walk without assistance. no significant deformity or arthritic changes, no loss or range of motion, no clubbing. Psychiatric Patient is not able to cooperate in decision making regarding care. Patient has dementia. patient is confused. General Notes: Upon inspection patient's wound bed actually showed signs of having some slough and biofilm at this point fortunately she is not really having pain which is good news and I will go ahead and see what I can do about cleaning away the necrotic debris. She tolerated the debridement without complication and postdebridement the wound bed appears to be doing much better which is great news. Overall I do not see any signs of significant worsening at this time. I do believe she has some swelling and I believe that it would be beneficial for [place] to try to get the swelling under control. Integumentary (Hair, Skin) Wound #1 status is Open. Original cause of wound was Skin T ear/Laceration. The date acquired was: 09/08/2022. The wound is located on the Right Lower Leg. The wound measures 5.5cm length x 2.2cm width x 0.7cm depth; 9.503cm^2 area and 6.652cm^3 volume. There is Fat Layer (Subcutaneous Tissue) exposed. There is no tunneling or undermining noted. There is a medium amount of serosanguineous  drainage noted. There is small (1-33%) red granulation within the wound bed. There is a large (67-100%) amount of necrotic tissue within the wound bed including Adherent Slough. Assessment Active Problems ICD-10 Laceration without foreign body, right lower leg, initial encounter Non-pressure chronic ulcer of other part of right lower leg with fat layer exposed Essential (primary) hypertension Vascular dementia, mild, with other behavioral disturbance Procedures Wound #1 Pre-procedure diagnosis of Wound #1 is a Skin T located on the Right Lower Leg . There was a Excisional Skin/Subcutaneous Tissue Debridement with a ear DURGA, SALDARRIAGA ([id number]) [id number] Page 7 of 10 total area of 9.5 sq cm With the following instrument(s): Curette to remove Viable and Non-Viable tissue/material. Material removed includes Subcutaneous Tissue and Slough and after achieving pain control using Lidocaine 4% T opical Solution. No specimens were taken. A time out was conducted at 09:12, prior to the start of the procedure. A Moderate amount of bleeding was controlled with Pressure. The procedure was tolerated well. Post Debridement Measurements: 5.5cm length x 2.2cm width x 0.9cm depth; 8.553cm^3 volume. Character of Wound/Ulcer Post Debridement is stable. Post procedure Diagnosis Wound #1: Same as Pre-Procedure Plan Follow-up Appointments: Return Appointment in 1 week. Nurse Visit as needed - to check first compression wrap Bathing/ Shower/ Hygiene: May shower with wound dressing protected with water repellent cover or cast  protector. No tub bath. Anesthetic (Use 'Patient Medications' Section for Anesthetic Order Entry): Lidocaine applied to wound bed Edema Control - Lymphedema / Segmental Compressive Device / Other: Elevate, Exercise Daily and Avoid Standing for Long Periods of Time. Elevate leg(s) parallel to the floor when sitting. DO YOUR BEST to sleep in the bed at  night. DO NOT sleep in your recliner. Long hours of sitting in a recliner leads to swelling of the legs and/or potential wounds on your backside. WOUND #1: - Lower Leg Wound Laterality: Right Cleanser: Soap and Water 2 x Per Week/15 Days Discharge Instructions: Gently cleanse wound with antibacterial soap, rinse and pat dry prior to dressing wounds Cleanser: Wound Cleanser 2 x Per Week/15 Days Discharge Instructions: Wash your hands with soap and water. Remove old dressing, discard into plastic bag and place into trash. Cleanse the wound with Wound Cleanser prior to applying a clean dressing using gauze sponges, not tissues or cotton balls. Do not scrub or use excessive force. Pat dry using gauze sponges, not tissue or cotton balls. Prim Dressing: Hydrofera Blue Ready Transfer Foam, 4x5 (in/in) 2 x Per Week/15 Days ary Discharge Instructions: Apply Hydrofera Blue Ready to wound bed as directed Secondary Dressing: Zetuvit Plus 4x4 (in/in) 2 x Per Week/15 Days Com pression Wrap: Urgo K2 Lite, two layer compression system, regular 2 x Per Week/15 Days 1. I would recommend that we go ahead and initiate treatment with a Hydrofera Blue dressing I think this is good to be the best way to go currently the patient's family is in agreement with plan. 2. I am also can recommend as well that the patient continue to utilize the [facility] followed by Zetuvit to cover. 3. I would recommend Urgo K2 lite compression wrap to try to keep the swelling under control. 4. I would also recommend that they monitor closely for her keeping this in place my hope is that they will be able to keep the dressing in place and allow this to heal appropriately. We will see patient back for reevaluation in 1 week here in the clinic. If anything worsens or changes patient will contact our office for additional recommendations. Electronic Signature(s) Signed: 10/03/2022 1:53:59 PM By: Allen Derry PA-C Entered By: Allen Derry  on 10/03/2022 13:53:58 -------------------------------------------------------------------------------- ROS/PFSH Details Patient Name: Date of Service: Jeanne Stewart, Jeanne Stewart. 10/02/2022 8:15 A M Medical Record Number: [id number] Patient Account Number: [id number] Date of Birth/Sex: Treating RN: [birth year] (82 y.o. [name] Primary Care Feliciano Wynter: [name] Other Clinician: Referring Zailyn Thoennes: Treating Darvell Monteforte/Extender: Augustin Coupe in Treatment: 0 Unable to Obtain Patient History due to Dementia ADAMARI, FREDE ([id number]) [id number] Page 8 of 10 Information Obtained From Patient Chart Other: daughter Constitutional Symptoms (General Health) Complaints and Symptoms: Negative for: Fatigue; Fever; Chills; Marked Weight Change Eyes Complaints and Symptoms: Negative for: Dry Eyes; Vision Changes; Glasses / Contacts Ear/Nose/Mouth/Throat Complaints and Symptoms: Negative for: Difficult clearing ears; Sinusitis Hematologic/Lymphatic Complaints and Symptoms: Negative for: Bleeding / Clotting Disorders; Human Immunodeficiency Virus Respiratory Complaints and Symptoms: Negative for: Chronic or frequent coughs; Shortness of Breath Medical History: Positive for: Asthma Gastrointestinal Complaints and Symptoms: Negative for: Frequent diarrhea; Nausea; Vomiting Endocrine Complaints and Symptoms: Negative for: Hepatitis; Thyroid disease; Polydypsia (Excessive Thirst) Genitourinary Complaints and Symptoms: Negative for: Kidney failure/ Dialysis; Incontinence/dribbling Immunological Complaints and Symptoms: Negative for: Hives; Itching Integumentary (Skin) Complaints and Symptoms: Negative for: Wounds; Bleeding or bruising tendency; Breakdown; Swelling Musculoskeletal Complaints and Symptoms: Negative for: Muscle Pain; Muscle Weakness Neurologic  Complaints and Symptoms: Negative for: Numbness/parasthesias; Focal/Weakness Medical  History: Positive for: Dementia Psychiatric Complaints and Symptoms: Negative for: Anxiety; Claustrophobia Cardiovascular Medical History: Positive for: Hypertension STEPHANI, JANAK ([id number]) [id number] Page 9 of 10 Oncologic Immunizations Pneumococcal Vaccine: Received Pneumococcal Vaccination: No Implantable Devices None Family and Social History Never smoker; Alcohol Use: Never; Drug Use: No History; Caffeine Use: Daily - diet dr Reino Kent Electronic Signature(s) Signed: 10/02/2022 4:49:27 PM By: Angelina Pih Entered By: Angelina Pih on 10/02/2022 10:05:46 -------------------------------------------------------------------------------- SuperBill Details Patient Name: Date of Service: Jeanne Stewart, Jeanne Stewart. 10/02/2022 Medical Record Number: [id number] Patient Account Number: [id number] Date of Birth/Sex: Treating RN: [birth year] (82 y.o. [name] Primary Care Valetta Mulroy: [name] Other Clinician: Referring Marcos Ruelas: Treating Izacc Demeyer/Extender: Augustin Coupe in Treatment: 0 Diagnosis Coding ICD-10 Codes Code Description [phone] Laceration without foreign body, right lower leg, initial encounter L97.812 Non-pressure chronic ulcer of other part of right lower leg with fat layer exposed I10 Essential (primary) hypertension F01.A18 Vascular dementia, mild, with other behavioral disturbance Facility Procedures : CPT4 Code: [id number] Description: 99213 - WOUND CARE VISIT-LEV 3 EST PT Modifier: Quantity: 1 : CPT4 Code: 60109323 Description: 11042 - DEB SUBQ TISSUE 20 SQ CM/< ICD-10 Diagnosis Description L97.812 Non-pressure chronic ulcer of other part of right lower leg with fat layer expos Modifier: ed Quantity: 1 Physician Procedures : CPT4 Code Description Modifier [id number] WC PHYS LEVEL 3 NEW PT 25 ICD-10 Diagnosis Description S81.811A Laceration without foreign body, right lower leg, initial encounter L97.812  Non-pressure chronic ulcer of other part of right lower leg with fat  layer exposed I10 Essential (primary) hypertension F01.A18 Vascular dementia, mild, with other behavioral disturbance Quantity: 1 : [id number] 11042 - WC PHYS SUBQ TISS 20 SQ CM ICD-10 Diagnosis Description L97.812 Non-pressure chronic ulcer of other part of right lower leg with fat layer exposed AMBRIEL, GORELICK ([id number]) [id number] Quantity: 1 f Page 10 of 10 Electronic Signature(s) Signed: 10/03/2022 1:54:19 PM By: Allen Derry PA-C Entered By: Allen Derry on 10/03/2022 13:54:19

## 2022-10-02 NOTE — Progress Notes (Signed)
Active Inactive Necrotic Tissue Nursing Diagnoses: Impaired tissue integrity related to necrotic/devitalized tissue Knowledge deficit related to management of necrotic/devitalized tissue Goals: Necrotic/devitalized tissue will be minimized in the wound bed Date Initiated: 10/02/2022 Target Resolution Date: 11/27/2022 Goal Status: Active Patient/caregiver will verbalize understanding of reason and process for debridement of necrotic tissue Date Initiated: 10/02/2022 Date Inactivated: 10/02/2022 Target Resolution Date: 10/02/2022 Goal Status: Met Interventions: Assess patient pain level pre-, during and post procedure and prior to discharge Provide education on necrotic tissue and debridement process Treatment Activities: Apply topical anesthetic as ordered : 10/02/2022 Excisional debridement :  10/02/2022 Laveda Abbe ([id number]) [id number] Page 6 of 9 Notes: Orientation to the Wound Care Program Nursing Diagnoses: Knowledge deficit related to the wound healing center program Goals: Patient/caregiver will verbalize understanding of the Wound Healing Center Program Date Initiated: 10/02/2022 Target Resolution Date: 10/09/2022 Goal Status: Active Interventions: Provide education on orientation to the wound center Notes: Wound/Skin Impairment Nursing Diagnoses: Impaired tissue integrity Knowledge deficit related to ulceration/compromised skin integrity Goals: Ulcer/skin breakdown will have a volume reduction of 30% by week 4 Date Initiated: 10/02/2022 Target Resolution Date: 10/30/2022 Goal Status: Active Ulcer/skin breakdown will have a volume reduction of 50% by week 8 Date Initiated: 10/02/2022 Target Resolution Date: 11/27/2022 Goal Status: Active Ulcer/skin breakdown will have a volume reduction of 80% by week 12 Date Initiated: 10/02/2022 Target Resolution Date: 12/25/2022 Goal Status: Active Ulcer/skin breakdown will heal within 14 weeks Date Initiated: 10/02/2022 Target Resolution Date: 01/08/2023 Goal Status: Active Interventions: Assess patient/caregiver ability to obtain necessary supplies Assess patient/caregiver ability to perform ulcer/skin care regimen upon admission and as needed Assess ulceration(s) every visit Provide education on ulcer and skin care Treatment Activities: Skin care regimen initiated : 10/02/2022 Notes: Electronic Signature(s) Signed: 10/02/2022 4:49:27 PM By: Angelina Pih Entered By: Angelina Pih on 10/02/2022 06:16:29 -------------------------------------------------------------------------------- Pain Assessment Details Patient Name: Date of Service: Jeanne Stewart. 10/02/2022 8:15 A M Medical Record Number: [id number] Patient Account Number: [id number] Date of Birth/Sex: Treating  RN: 1941/01/05 (82 y.o. [name] Primary Care Osmar Howton: [name] Other Clinician: Referring Sylvan Sookdeo: Treating Yvana Samonte/Extender: Sharen Heck in Treatment: [address] Jeanne Stewart, Jeanne Stewart ([id number]) [id number] Page 7 of 9 Location of Pain Severity and Description of Pain Patient Has Paino No Site Locations Rate the pain. Current Pain Level: 0 Pain Management and Medication Current Pain Management: Electronic Signature(s) Signed: 10/02/2022 4:49:27 PM By: Angelina Pih Entered By: Angelina Pih on 10/02/2022 05:34:53 -------------------------------------------------------------------------------- Patient/Caregiver Education Details Patient Name: Date of Service: Jeanne Stewart 9/26/2024andnbsp8:15 A M Medical Record Number: [id number] Patient Account Number: [id number] Date of Birth/Gender: Treating RN: August 29, 1940 (82 y.o. [name] Primary Care Physician: [name] Other Clinician: Referring Physician: Treating Physician/Extender: Sharen Heck in Treatment: 0 Education Assessment Education Provided To: Patient and Caregiver Education Topics Provided Welcome T [facility]New Patient Packet: o Handouts: The W[facility] Healing Pledge form, Welcome T [facility] o Methods: Explain/Verbal Responses: State content correctly Wound Debridement: Handouts: Wound Debridement Methods: Explain/Verbal Responses: State content correctly Wound/Skin Impairment: Handouts: Caring for Your Ulcer Methods: Explain/Verbal Responses: State content correctly Jeanne Stewart, Jeanne Stewart ([id number]) [id number] Page 8 of 9 Electronic Signature(s) Signed: 10/02/2022 4:49:27 PM By: Angelina Pih Entered By: Angelina Pih on 10/02/2022 07:07:15 -------------------------------------------------------------------------------- Wound Assessment Details Patient Name: Date of  Service: Jeanne Stewart, Jeanne Stewart 10/02/2022 8:15 A M Medical Record Number: [id number] Patient Account Number: [id number] Date of Birth/Sex: Treating RN: 11/02/1940 (81  y.o. [name] Primary Care Jehad Bisono: [name] Other Clinician: Referring Eiman Maret: Treating Arbadella Kimbler/Extender: Letta Moynahan Weeks in Treatment: 0 Wound Status Wound Number: 1 Primary Etiology: Skin Tear Wound Location: Right Lower Leg Wound Status: Open Wounding Event: Skin Tear/Laceration Comorbid History: Asthma, Hypertension Date Acquired: 09/08/2022 Weeks Of Treatment: 0 Clustered Wound: No Photos Wound Measurements Length: (cm) 5.5 Width: (cm) 2.2 Depth: (cm) 0.7 Area: (cm) 9.503 Volume: (cm) 6.652 % Reduction in Area: % Reduction in Volume: Epithelialization: None Tunneling: No Undermining: No Wound Description Classification: Full Thickness Without Exposed Suppor Exudate Amount: Medium Exudate Type: Serosanguineous Exudate Color: red, brown t Structures Foul Odor After Cleansing: No Slough/Fibrino Yes Wound Bed Granulation Amount: Small (1-33%) Exposed Structure Granulation Quality: Red Fat Layer (Subcutaneous Tissue) Exposed: Yes Necrotic Amount: Large (67-100%) Necrotic Quality: Adherent Slough Treatment Notes Wound #1 (Lower Leg) Wound Laterality: Right Jeanne Stewart, Jeanne Stewart ([id number]) [id number] Page 9 of 9 Cleanser Soap and Water Discharge Instruction: Gently cleanse wound with antibacterial soap, rinse and pat dry prior to dressing wounds Wound Cleanser Discharge Instruction: Wash your hands with soap and water. Remove old dressing, discard into plastic bag and place into trash. Cleanse the wound with Wound Cleanser prior to applying a clean dressing using gauze sponges, not tissues or cotton balls. Do not scrub or use excessive force. Pat dry using gauze sponges, not tissue or cotton balls. Peri-Wound Care Topical Primary Dressing Hydrofera Blue Ready  Transfer Foam, 4x5 (in/in) Discharge Instruction: Apply Hydrofera Blue Ready to wound bed as directed Secondary Dressing Zetuvit Plus 4x4 (in/in) Secured With Compression Wrap Urgo K2 Lite, two layer compression system, regular Compression Stockings Add-Ons Electronic Signature(s) Signed: 10/02/2022 9:07:48 AM By: Allen Derry PA-C Signed: 10/02/2022 4:49:27 PM By: Angelina Pih Entered By: Allen Derry on 10/02/2022 06:07:48 -------------------------------------------------------------------------------- Vitals Details Patient Name: Date of Service: Jeanne Stewart, Jeanne Stewart. 10/02/2022 8:15 A M Medical Record Number: [id number] Patient Account Number: [id number] Date of Birth/Sex: Treating RN: [birth year] (82 y.o. [name] Primary Care Ardella Chhim: [name] Other Clinician: Referring Kahiau Schewe: Treating Lovell Nuttall/Extender: Letta Moynahan Weeks in Treatment: 0 Vital Signs Time Taken: 08:34 Temperature (F): 97.7 Height (in): 60 Pulse (bpm): 71 Source: Stated Respiratory Rate (breaths/min): 18 Weight (lbs): 130 Blood Pressure (mmHg): 122/79 Source: Stated Reference Range: 80 - 120 mg / dl Body Mass Index (BMI): 25.4 Electronic Signature(s) Signed: 10/02/2022 4:49:27 PM By: Angelina Pih Entered By: Angelina Pih on 10/02/2022 05:35:20  years: Yes Total Score: 100 Level Of Care: New/Established - Level 3 Electronic Signature(s) Signed: 10/02/2022 4:49:27 PM By: Angelina Pih Entered By: Angelina Pih on 10/02/2022 06:14:48 -------------------------------------------------------------------------------- Encounter Discharge Information Details Patient Name: Date of Service: Jeanne Stewart, Jeanne Stewart. 10/02/2022 8:15 A M Medical Record Number: [id number] Patient Account Number: [id number] Date of Birth/Sex: Treating RN: 1940/03/13 (82 y.o. [name] Primary Care Synda Bagent: [name] Other Clinician: Referring Brax Walen: Treating Addy Mcmannis/Extender: Letta Moynahan Weeks in Treatment: 0 Encounter Discharge Information Items Post Procedure Vitals Discharge Condition: Stable Temperature (F): 97.7 Ambulatory Status: Ambulatory Pulse (bpm): 71 Discharge Destination: Home Respiratory Rate (breaths/min): 18 Transportation: Private Auto Blood Pressure (mmHg): 122/79 Accompanied By: daughter Schedule Follow-up Appointment: Yes Clinical Summary of Care: Electronic Signature(s) Signed: 10/02/2022 10:07:56 AM By: Angelina Pih Entered By: Angelina Pih on 10/02/2022 07:07:56 -------------------------------------------------------------------------------- Lower Extremity Assessment Details Patient Name: Date of Service: Jeanne Stewart, FERDERER. 10/02/2022 8:15 A M Medical Record Number: [id number] Patient Account Number: [id number] Date of Birth/Sex: Treating RN: 12-12-1940 (82 y.o. [name] Primary Care Cailah Reach: [name] Other Clinician: Referring Anaalicia Reimann: Treating Dyami Umbach/Extender: Letta Moynahan Weeks in  Treatment: 0 Edema Assessment Assessed: [Left: No] [Right: No] Edema: [Left: Ye] [Right: s] Calf Left: Right: Point of Measurement: 30 cm From Medial Instep 33.2 cm Jeanne Stewart, Jeanne Stewart ([id number]) [id number] Page 4 of 9 Ankle Left: Right: Point of Measurement: 11 cm From Medial Instep 20 cm Vascular Assessment Pulses: Dorsalis Pedis Palpable: [Right:Yes] Posterior Tibial Doppler Audible: [Right:Yes] Extremity colors, hair growth, and conditions: Extremity Color: [Right:Red] Hair Growth on Extremity: [Right:Yes] Temperature of Extremity: [Right:Cool] Capillary Refill: [Right:< 3 seconds] Blood Pressure: Brachial: [Right:122] Ankle: [Right:Dorsalis Pedis: 160 1.31] Toe Nail Assessment Left: Right: Thick: No Discolored: No Deformed: No Improper Length and Hygiene: No Electronic Signature(s) Signed: 10/02/2022 4:49:27 PM By: Angelina Pih Entered By: Angelina Pih on 10/02/2022 05:50:28 -------------------------------------------------------------------------------- Multi Wound Chart Details Patient Name: Date of Service: Jeanne Stewart, Jeanne Stewart. 10/02/2022 8:15 A M Medical Record Number: [id number] Patient Account Number: [id number] Date of Birth/Sex: Treating RN: 05/19/1940 (82 y.o. [name] Primary Care Hughey Rittenberry: [name] Other Clinician: Referring Zigmund Linse: Treating Luba Matzen/Extender: Letta Moynahan Weeks in Treatment: 0 Vital Signs Height(in): 60 Pulse(bpm): 71 Weight(lbs): 130 Blood Pressure(mmHg): 122/79 Body Mass Index(BMI): 25.4 Temperature(F): 97.7 Respiratory Rate(breaths/min): 18 [1:Photos:] [N/A:N/A] Right Lower Leg N/A N/A Wound Location: Skin Tear/Laceration N/A N/A Wounding Event: Skin Tear N/A N/A Primary Etiology: Asthma, Hypertension N/A N/A Comorbid History: 09/08/2022 N/A N/A Date Acquired: 0 N/A N/A Weeks of Treatment: Open N/A N/A Wound Status: No N/A N/A Wound Recurrence: 5.5x2.2x0.7 N/A  N/A Measurements L x W x D (cm) 9.503 N/A N/A A (cm) : rea 6.652 N/A N/A Volume (cm) : Full Thickness Without Exposed N/A N/A Classification: Support Structures Medium N/A N/A Exudate A mount: Serosanguineous N/A N/A Exudate Type: red, brown N/A N/A Exudate Color: Small (1-33%) N/A N/A Granulation A mount: Red N/A N/A Granulation Quality: Large (67-100%) N/A N/A Necrotic A mount: Fat Layer (Subcutaneous Tissue): Yes N/A N/A Exposed Structures: None N/A N/A Epithelialization: Treatment Notes Electronic Signature(s) Signed: 10/02/2022 4:49:27 PM By: Angelina Pih Entered By: Angelina Pih on 10/02/2022 06:11:09 -------------------------------------------------------------------------------- Multi-Disciplinary Care Plan Details Patient Name: Date of Service: Jeanne Stewart, Jeanne Stewart. 10/02/2022 8:15 A M Medical Record Number: [id number] Patient Account Number: [id number] Date of Birth/Sex: Treating RN: 1940-03-15 (82 y.o. [name] Primary Care Satori Krabill: [name] Other Clinician: Referring Jencarlos Nicolson: Treating Xylah Early/Extender: Letta Moynahan Weeks in Treatment: 82  y.o. [name] Primary Care Jehad Bisono: [name] Other Clinician: Referring Eiman Maret: Treating Arbadella Kimbler/Extender: Letta Moynahan Weeks in Treatment: 0 Wound Status Wound Number: 1 Primary Etiology: Skin Tear Wound Location: Right Lower Leg Wound Status: Open Wounding Event: Skin Tear/Laceration Comorbid History: Asthma, Hypertension Date Acquired: 09/08/2022 Weeks Of Treatment: 0 Clustered Wound: No Photos Wound Measurements Length: (cm) 5.5 Width: (cm) 2.2 Depth: (cm) 0.7 Area: (cm) 9.503 Volume: (cm) 6.652 % Reduction in Area: % Reduction in Volume: Epithelialization: None Tunneling: No Undermining: No Wound Description Classification: Full Thickness Without Exposed Suppor Exudate Amount: Medium Exudate Type: Serosanguineous Exudate Color: red, brown t Structures Foul Odor After Cleansing: No Slough/Fibrino Yes Wound Bed Granulation Amount: Small (1-33%) Exposed Structure Granulation Quality: Red Fat Layer (Subcutaneous Tissue) Exposed: Yes Necrotic Amount: Large (67-100%) Necrotic Quality: Adherent Slough Treatment Notes Wound #1 (Lower Leg) Wound Laterality: Right Jeanne Stewart, Jeanne Stewart ([id number]) [id number] Page 9 of 9 Cleanser Soap and Water Discharge Instruction: Gently cleanse wound with antibacterial soap, rinse and pat dry prior to dressing wounds Wound Cleanser Discharge Instruction: Wash your hands with soap and water. Remove old dressing, discard into plastic bag and place into trash. Cleanse the wound with Wound Cleanser prior to applying a clean dressing using gauze sponges, not tissues or cotton balls. Do not scrub or use excessive force. Pat dry using gauze sponges, not tissue or cotton balls. Peri-Wound Care Topical Primary Dressing Hydrofera Blue Ready  Transfer Foam, 4x5 (in/in) Discharge Instruction: Apply Hydrofera Blue Ready to wound bed as directed Secondary Dressing Zetuvit Plus 4x4 (in/in) Secured With Compression Wrap Urgo K2 Lite, two layer compression system, regular Compression Stockings Add-Ons Electronic Signature(s) Signed: 10/02/2022 9:07:48 AM By: Allen Derry PA-C Signed: 10/02/2022 4:49:27 PM By: Angelina Pih Entered By: Allen Derry on 10/02/2022 06:07:48 -------------------------------------------------------------------------------- Vitals Details Patient Name: Date of Service: Jeanne Stewart, Jeanne Stewart. 10/02/2022 8:15 A M Medical Record Number: [id number] Patient Account Number: [id number] Date of Birth/Sex: Treating RN: [birth year] (82 y.o. [name] Primary Care Ardella Chhim: [name] Other Clinician: Referring Kahiau Schewe: Treating Lovell Nuttall/Extender: Letta Moynahan Weeks in Treatment: 0 Vital Signs Time Taken: 08:34 Temperature (F): 97.7 Height (in): 60 Pulse (bpm): 71 Source: Stated Respiratory Rate (breaths/min): 18 Weight (lbs): 130 Blood Pressure (mmHg): 122/79 Source: Stated Reference Range: 80 - 120 mg / dl Body Mass Index (BMI): 25.4 Electronic Signature(s) Signed: 10/02/2022 4:49:27 PM By: Angelina Pih Entered By: Angelina Pih on 10/02/2022 05:35:20

## 2022-10-06 DIAGNOSIS — L97812 Non-pressure chronic ulcer of other part of right lower leg with fat layer exposed: Secondary | ICD-10-CM | POA: Diagnosis not present

## 2022-10-06 NOTE — Progress Notes (Addendum)
Jeanne Stewart, Jeanne Stewart ([id number]) [id number] Page 1 of 2 Visit Report for 10/06/2022 Physician Orders Details Patient Name: Date of Service: Jeanne Stewart, Jeanne Stewart 10/06/2022 3:45 PM Medical Record Number: [id number] Patient Account Number: [id number] Date of Birth/Sex: Treating RN: [birth year] (82 y.o. Jeanne Stewart Primary Care Provider: [name] Other Clinician: Referring Provider: Treating Provider/Extender: [name] Weeks in Treatment: 0 Verbal / Phone Orders: No Diagnosis Coding Follow-up Appointments Return Appointment in 1 week. Nurse Visit as needed - to check first compression wrap Bathing/ Shower/ Hygiene May shower with wound dressing protected with water repellent cover or cast protector. No tub bath. Anesthetic (Use 'Patient Medications' Section for Anesthetic Order Entry) Lidocaine applied to wound bed Edema Control - Lymphedema / Segmental Compressive Device / Other Elevate, Exercise Daily and A void Standing for Long Periods of Time. Elevate leg(s) parallel to the floor when sitting. DO YOUR BEST to sleep in the bed at night. DO NOT sleep in your recliner. Long hours of sitting in a recliner leads to swelling of the legs and/or potential wounds on your backside. Wound Treatment Wound #1 - Lower Leg Wound Laterality: Right Cleanser: [place]ram Ancillary Kit - 15 Day Supply (DME) (Generic) 3 x Per Week/15 Days Discharge Instructions: Use supplies as instructed; Kit contains: (15) Saline Bullets; (15) 3x3 Gauze; 15 pr Gloves Cleanser: Soap and Water 3 x Per Week/15 Days Discharge Instructions: Gently cleanse wound with antibacterial soap, rinse and pat dry prior to dressing wounds Cleanser: Wound Cleanser 3 x Per Week/15 Days Discharge Instructions: Wash your hands with soap and water. Remove old dressing, discard into plastic bag and place into trash. Cleanse the wound with Wound Cleanser prior to applying a clean dressing  using gauze sponges, not tissues or cotton balls. Do not scrub or use excessive force. Pat dry using gauze sponges, not tissue or cotton balls. Prim Dressing: Hydrofera Blue Ready Transfer Foam, 2.5x2.5 (in/in) (Generic) 3 x Per Week/15 Days ary Discharge Instructions: Apply Hydrofera Blue Ready to wound bed as directed Secondary Dressing: (BORDER) Zetuvit Plus SILICONE BORDER Dressing 5x5 (in/in) (DME) (Generic) 3 x Per Week/15 Days Discharge Instructions: Please do not put silicone bordered dressings under wraps. Use non-bordered dressing only. Secured With: Tubigrip Size D, 3x10 (in/yd) 3 x Per Week/15 Days Electronic Signature(s) Signed: 10/06/2022 4:31:09 PM By: Angelina Pih Signed: 10/06/2022 4:50:01 PM By: Allen Derry PA-C Previous Signature: 10/06/2022 4:18:59 PM Version By: Angelina Pih Entered By: Angelina Pih on 10/06/2022 16:27:05 Laveda Abbe ([id number]) [id number] Page 2 of 2 -------------------------------------------------------------------------------- SuperBill Details Patient Name: Date of Service: Jeanne Stewart, Jeanne Stewart 10/06/2022 Medical Record Number: [id number] Patient Account Number: [id number] Date of Birth/Sex: Treating RN: 12-02-1940 (82 y.o. 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

## 2022-10-06 NOTE — Progress Notes (Signed)
LEJLA, MOESER ([id number]) [id number] Page 1 of 4 Visit Report for 10/06/2022 Arrival Information Details Patient Name: Date of Service: Jeanne Stewart, Jeanne Stewart 10/06/2022 3:45 PM Medical Record Number: [id number] Patient Account Number: [id number] Date of Birth/Sex: Treating RN: 06-18-1940 (82 y.o. [name] Primary Care Moriya Mitchell: [name] Other Clinician: Referring Shaquanda Graves: Treating Benjamen Koelling/Extender: [name] Weeks in Treatment: 0 Visit Information History Since Last Visit Added or deleted any medications: No Patient Arrived: Ambulatory Any new allergies or adverse reactions: No Arrival Time: 16:15 Had a fall or experienced change in No Accompanied By: family activities of daily living that may affect Transfer Assistance: None risk of falls: Patient Identification Verified: Yes Hospitalized since last visit: No Secondary Verification Process Completed: Yes Has Dressing in Place as Prescribed: Yes Pain Present Now: No Electronic Signature(s) Signed: 10/06/2022 4:15:49 PM By: Angelina Pih Entered By: Angelina Pih on 10/06/2022 16:15:49 -------------------------------------------------------------------------------- Clinic Level of Care Assessment Details Patient Name: Date of Service: Jeanne, Stewart 10/06/2022 3:45 PM Medical Record Number: [id number] Patient Account Number: [id number] Date of Birth/Sex: Treating RN: 02/23/1940 (82 y.o. [name] Primary Care Mable Lashley: [name] Other Clinician: Referring Brycelynn Stampley: Treating Darelle Kings/Extender: [name] Weeks in Treatment: 0 Clinic Level of Care Assessment Items TOOL 4 Quantity Score []  - 0 Use when only an EandM is performed on FOLLOW-UP visit ASSESSMENTS - Nursing Assessment / Reassessment X- 1 10 Reassessment of Co-morbidities (includes updates in patient status) X- 1 5 Reassessment of Adherence to Treatment  Plan ASSESSMENTS - Wound and Skin A ssessment / Reassessment X - Simple Wound Assessment / Reassessment - one wound 1 5 []  - 0 Complex Wound Assessment / Reassessment - multiple wounds KIARRAH, RAUSCH ([id number]) [id number] Page 2 of 4 []  - 0 Dermatologic / Skin Assessment (not related to wound area) ASSESSMENTS - Focused Assessment []  - 0 Circumferential Edema Measurements - multi extremities []  - 0 Nutritional Assessment / Counseling / Intervention []  - 0 Lower Extremity Assessment (monofilament, tuning fork, pulses) []  - 0 Peripheral Arterial Disease Assessment (using hand held doppler) ASSESSMENTS - Ostomy and/or Continence Assessment and Care []  - 0 Incontinence Assessment and Management []  - 0 Ostomy Care Assessment and Management (repouching, etc.) PROCESS - Coordination of Care X - Simple Patient / Family Education for ongoing care 1 15 []  - 0 Complex (extensive) Patient / Family Education for ongoing care X- 1 10 Staff obtains Chiropractor, Records, T Results / Process Orders est []  - 0 Staff telephones HHA, Nursing Homes / Clarify orders / etc []  - 0 Routine Transfer to another Facility (non-emergent condition) []  - 0 Routine Hospital Admission (non-emergent condition) []  - 0 New Admissions / Manufacturing engineer / Ordering NPWT Apligraf, etc. , []  - 0 Emergency Hospital Admission (emergent condition) X- 1 10 Simple Discharge Coordination []  - 0 Complex (extensive) Discharge Coordination PROCESS - Special Needs []  - 0 Pediatric / Minor Patient Management []  - 0 Isolation Patient Management []  - 0 Hearing / Language / Visual special needs []  - 0 Assessment of Community assistance (transportation, D/C planning, etc.) []  - 0 Additional assistance / Altered mentation []  - 0 Support Surface(s) Assessment (bed, cushion, seat, etc.) INTERVENTIONS - Wound Cleansing / Measurement X - Simple Wound Cleansing - one wound 1 5 []  -  0 Complex Wound Cleansing - multiple wounds X- 1 5 Wound Imaging (photographs - any number of wounds) []  - 0 Wound Tracing (instead of photographs) X- 1 5 Simple Wound Measurement -  one wound []  - 0 Complex Wound Measurement - multiple wounds INTERVENTIONS - Wound Dressings X - Small Wound Dressing one or multiple wounds 1 10 []  - 0 Medium Wound Dressing one or multiple wounds []  - 0 Large Wound Dressing one or multiple wounds []  - 0 Application of Medications - topical []  - 0 Application of Medications - injection INTERVENTIONS - Miscellaneous []  - 0 External ear exam []  - 0 Specimen Collection (cultures, biopsies, blood, body fluids, etc.) []  - 0 Specimen(s) / Culture(s) sent or taken to Lab for analysis SAIDI, SANTACROCE ([id number]) [id number] Page 3 of 4 []  - 0 Patient Transfer (multiple staff / Nurse, adult / Similar devices) []  - 0 Simple Staple / Suture removal (25 or less) []  - 0 Complex Staple / Suture removal (26 or more) []  - 0 Hypo / Hyperglycemic Management (close monitor of Blood Glucose) []  - 0 Ankle / Brachial Index (ABI) - do not check if billed separately []  - 0 Vital Signs Has the patient been seen at the hospital within the last three years: Yes Total Score: 80 Level Of Care: New/Established - Level 3 Electronic Signature(s) Signed: 10/06/2022 4:31:09 PM By: Angelina Pih Entered By: Angelina Pih on 10/06/2022 16:28:24 -------------------------------------------------------------------------------- Encounter Discharge Information Details Patient Name: Date of Service: Jeanne Stewart. 10/06/2022 3:45 PM Medical Record Number: [id number] Patient Account Number: [id number] Date of Birth/Sex: Treating RN: 11/16/1940 (82 y.o. [name] Primary Care Kytzia Gienger: [name] Other Clinician: Referring Micholas Drumwright: Treating Tidus Upchurch/Extender: [name] Weeks in Treatment: 0 Encounter Discharge  Information Items Discharge Condition: Stable Ambulatory Status: Ambulatory Discharge Destination: Home Transportation: Private Auto Accompanied By: family Schedule Follow-up Appointment: Yes Clinical Summary of Care: Electronic Signature(s) Signed: 10/06/2022 4:27:43 PM By: Angelina Pih Entered By: Angelina Pih on 10/06/2022 16:27:43 -------------------------------------------------------------------------------- Wound Assessment Details Patient Name: Date of Service: Jeanne Stewart. 10/06/2022 3:45 PM Medical Record Number: [id number] Patient Account Number: [id number] Date of Birth/Sex: Treating RN: 31-Dec-1940 (82 y.o. [name] Primary Care Zaydah Nawabi: [name] Other Clinician: Referring Kewan Mcnease: Treating Chaze Hruska/Extender: [name] Flippin Grafton ([id number]) [id number] Page 4 of 4 Weeks in Treatment: 0 Wound Status Wound Number: 1 Primary Etiology: Skin Tear Wound Location: Right Lower Leg Wound Status: Open Wounding Event: Skin Tear/Laceration Comorbid History: Asthma, Hypertension, Dementia Date Acquired: 09/08/2022 Weeks Of Treatment: 0 Clustered Wound: No Wound Measurements Length: (cm) 5.5 Width: (cm) 2.2 Depth: (cm) 0.7 Area: (cm) 9.503 Volume: (cm) 6.652 % Reduction in Area: 0% % Reduction in Volume: 0% Epithelialization: None Wound Description Classification: Full Thickness Without Exposed Suppor Exudate Amount: Medium Exudate Type: Serosanguineous Exudate Color: red, brown t Structures Foul Odor After Cleansing: No Slough/Fibrino Yes Wound Bed Granulation Amount: Small (1-33%) Exposed Structure Granulation Quality: Red Fat Layer (Subcutaneous Tissue) Exposed: Yes Necrotic Amount: Large (67-100%) Necrotic Quality: Adherent Slough Treatment Notes Wound #1 (Lower Leg) Wound Laterality: Right Cleanser [place] Ancillary Kit - 15 Day Supply Discharge Instruction: Use supplies as  instructed; Kit contains: (15) Saline Bullets; (15) 3x3 Gauze; 15 pr Gloves Soap and Water Discharge Instruction: Gently cleanse wound with antibacterial soap, rinse and pat dry prior to dressing wounds Wound Cleanser Discharge Instruction: Wash your hands with soap and water. Remove old dressing, discard into plastic bag and place into trash. Cleanse the wound with Wound Cleanser prior to applying a clean dressing using gauze sponges, not tissues or cotton balls. Do not scrub or use excessive force. Pat dry using gauze sponges, not tissue  or cotton balls. Peri-Wound Care Topical Primary Dressing Hydrofera Blue Ready Transfer Foam, 2.5x2.5 (in/in) Discharge Instruction: Apply Hydrofera Blue Ready to wound bed as directed Secondary Dressing (BORDER) Zetuvit Plus SILICONE BORDER Dressing 5x5 (in/in) Discharge Instruction: Please do not put silicone bordered dressings under wraps. Use non-bordered dressing only. Secured With Tubigrip Size D, 3x10 (in/yd) Compression Wrap Compression Stockings Add-Ons Electronic Signature(s) Signed: 10/06/2022 4:16:03 PM By: Angelina Pih Entered By: Angelina Pih on 10/06/2022 16:16:03

## 2022-10-09 ENCOUNTER — Encounter: Payer: Medicare HMO | Attending: Physician Assistant | Admitting: Physician Assistant

## 2022-10-09 DIAGNOSIS — X58XXXA Exposure to other specified factors, initial encounter: Secondary | ICD-10-CM | POA: Insufficient documentation

## 2022-10-09 DIAGNOSIS — I10 Essential (primary) hypertension: Secondary | ICD-10-CM | POA: Diagnosis not present

## 2022-10-09 DIAGNOSIS — F01A18 Vascular dementia, mild, with other behavioral disturbance: Secondary | ICD-10-CM | POA: Insufficient documentation

## 2022-10-09 DIAGNOSIS — L97812 Non-pressure chronic ulcer of other part of right lower leg with fat layer exposed: Secondary | ICD-10-CM | POA: Insufficient documentation

## 2022-10-09 DIAGNOSIS — S81811A Laceration without foreign body, right lower leg, initial encounter: Secondary | ICD-10-CM | POA: Diagnosis present

## 2022-10-09 NOTE — Progress Notes (Addendum)
Jeanne, Stewart ([id number]) [id number] Page 1 of 6 Visit Report for 10/09/2022 Chief Complaint Document Details Patient Name: Date of Service: Jeanne Stewart, Jeanne Stewart. 10/09/2022 2:00 PM Medical Record Number: [id number] Patient Account Number: [id number] Date of Birth/Sex: Treating RN: Oct 05, 1940 (82 y.o. Jeanne Stewart Primary Care Provider: [name] Other Clinician: Referring Provider: Treating Provider/Extender: [name] Weeks in Treatment: 1 Information Obtained from: Patient Chief Complaint Skin tear right leg Electronic Signature(s) Signed: 10/09/2022 2:07:46 PM By: Allen Derry PA-C Entered By: Allen Derry on 10/09/2022 11:07:46 -------------------------------------------------------------------------------- HPI Details Patient Name: Date of Service: Jeanne Stewart, Jeanne Stewart. 10/09/2022 2:00 PM Medical Record Number: [id number] Patient Account Number: [id number] Date of Birth/Sex: Treating RN: [birth year] (82 y.o. Jeanne Stewart Primary Care Provider: [name] Other Clinician: Referring Provider: Treating Provider/Extender: [name] Weeks in Treatment: 1 History of Present Illness Chronic/Inactive Conditions Condition 1: 10-02-2022 patient's ABI on the office today was 1.31 on the right on screening. HPI Description: 10-02-2022 upon evaluation today patient presents for initial inspection here in our clinic concerning issues that she has been having with a wound over the right anterior lower extremity. This occurred on September 08, 2022. She states this happened on Labor Day she did not go to the ER. This is a laceration which unfortunately has opened further at this point. There does appear to be some need for sharp debridement to clearway necrotic debris. We are definitely can work on this and try to see what we can do about getting this moving in the right direction much more effectively and  quickly. Patient does have a history of hypertension along with vascular dementia. Unfortunately she takes most dressings off that are put in place and I think that is going to be the biggest issue here. 10-09-2022 upon evaluation today patient appears to be doing well currently in regard to her wound which is actually measuring better and looking better as well today. Fortunately I do not see any signs of active infection which is good news. Unfortunately the biggest issue here is we have been having trouble getting her to keep her dressing in place and intact. With the dementia she is consistently taking this off unfortunately. Electronic Signature(s) Jeanne, Stewart ([id number]) [id number] Page 2 of 6 Signed: 10/09/2022 5:43:25 PM By: Allen Derry PA-C Entered By: Allen Derry on 10/09/2022 14:43:25 -------------------------------------------------------------------------------- Physical Exam Details Patient Name: Date of Service: Jeanne Stewart, Jeanne Stewart. 10/09/2022 2:00 PM Medical Record Number: [id number] Patient Account Number: [id number] Date of Birth/Sex: Treating RN: 1940/03/12 (82 y.o. Jeanne Stewart Primary Care Provider: [name] Other Clinician: Referring Provider: Treating Provider/Extender: [name] Weeks in Treatment: 1 Constitutional Well-nourished and well-hydrated in no acute distress. Respiratory normal breathing without difficulty. Psychiatric this patient is able to make decisions and demonstrates good insight into disease process. Alert and Oriented x 3. pleasant and cooperative. Notes Patient's wound did not require sharp debridement and actually appears to be quite healthy and looking quite well and very pleased with how things are here and I am hopeful we can continue with the plan currently. Electronic Signature(s) Signed: 10/09/2022 5:43:47 PM By: Allen Derry PA-C Entered By: Allen Derry on 10/09/2022  14:43:47 -------------------------------------------------------------------------------- Physician Orders Details Patient Name: Date of Service: Jeanne Stewart, Jeanne Stewart. 10/09/2022 2:00 PM Medical Record Number: [id number] Patient Account Number: [id number] Date of Birth/Sex: Treating RN: 08/22/1940 (82 y.o. Jeanne Stewart Primary Care Provider: [name] Other Clinician: Referring Provider: Treating Provider/Extender: Larina Bras,  Jeanne Stewart, Jeanne Stewart Weeks in Treatment: 1 Verbal / Phone Orders: No Diagnosis Coding ICD-10 Coding Code Description [phone] Laceration without foreign body, right lower leg, initial encounter L97.812 Non-pressure chronic ulcer of other part of right lower leg with fat layer exposed I10 Essential (primary) hypertension Jeanne, Stewart ([id number]) [id number] Page 3 of 6 F01.A18 Vascular dementia, mild, with other behavioral disturbance Follow-up Appointments Return Appointment in 1 week. Bathing/ Shower/ Hygiene May shower with wound dressing protected with water repellent cover or cast protector. No tub bath. Anesthetic (Use 'Patient Medications' Section for Anesthetic Order Entry) Lidocaine applied to wound bed Edema Control - Lymphedema / Segmental Compressive Device / Other Elevate, Exercise Daily and A void Standing for Long Periods of Time. Elevate leg(s) parallel to the floor when sitting. DO YOUR BEST to sleep in the bed at night. DO NOT sleep in your recliner. Long hours of sitting in a recliner leads to swelling of the legs and/or potential wounds on your backside. Wound Treatment Wound #1 - Lower Leg Wound Laterality: Right Cleanser: Byram Ancillary Kit - 15 Day Supply (Generic) 3 x Per Week/15 Days Discharge Instructions: Use supplies as instructed; Kit contains: (15) Saline Bullets; (15) 3x3 Gauze; 15 pr Gloves Cleanser: Soap and Water 3 x Per Week/15 Days Discharge Instructions: Gently cleanse wound with  antibacterial soap, rinse and pat dry prior to dressing wounds Cleanser: Wound Cleanser 3 x Per Week/15 Days Discharge Instructions: Wash your hands with soap and water. Remove old dressing, discard into plastic bag and place into trash. Cleanse the wound with Wound Cleanser prior to applying a clean dressing using gauze sponges, not tissues or cotton balls. Do not scrub or use excessive force. Pat dry using gauze sponges, not tissue or cotton balls. Prim Dressing: Hydrofera Blue Ready Transfer Foam, 2.5x2.5 (in/in) (Generic) 3 x Per Week/15 Days ary Discharge Instructions: placed small piece in deeper area, than larger piece over top Apply Hydrofera Blue Ready to wound bed as directed Secondary Dressing: (BORDER) Zetuvit Plus SILICONE BORDER Dressing 5x5 (in/in) (Generic) 3 x Per Week/15 Days Discharge Instructions: Please do not put silicone bordered dressings under wraps. Use non-bordered dressing only. Secured With: Tubigrip Size D, 3x10 (in/yd) 3 x Per Week/15 Days Electronic Signature(s) Signed: 10/09/2022 4:54:50 PM By: Angelina Pih Signed: 10/10/2022 2:08:40 PM By: Allen Derry PA-C Entered By: Angelina Pih on 10/09/2022 11:45:27 -------------------------------------------------------------------------------- Problem List Details Patient Name: Date of Service: Jeanne Stewart, Jeanne Stewart. 10/09/2022 2:00 PM Medical Record Number: [id number] Patient Account Number: [id number] Date of Birth/Sex: Treating RN: [birth year] (81 y.o. Jeanne Stewart Primary Care Provider: [name] Other Clinician: Referring Provider: Treating Provider/Extender: [name] Weeks in Treatment: 1 Active Problems ICD-10 Encounter Code Description Active Date MDM Diagnosis S81.811A Laceration without foreign body, right lower leg, initial encounter 10/02/2022 No Yes MAKAIYAH, Jeanne Stewart ([id number]) [id number] Page 4 of 6 [phone] Non-pressure chronic ulcer of  other part of right lower leg with fat layer 10/02/2022 No Yes exposed I10 Essential (primary) hypertension 10/02/2022 No Yes F01.A18 Vascular dementia, mild, with other behavioral disturbance 10/02/2022 No Yes Inactive Problems Resolved Problems Electronic Signature(s) Signed: 10/09/2022 2:07:43 PM By: Allen Derry PA-C Entered By: Allen Derry on 10/09/2022 11:07:43 -------------------------------------------------------------------------------- Progress Note Details Patient Name: Date of Service: Jeanne Stewart, Jeanne Stewart. 10/09/2022 2:00 PM Medical Record Number: [id number] Patient Account Number: [id number] Date of Birth/Sex: Treating RN: [birth year] (82 y.o. Jeanne Stewart Primary Care Provider: [name] Other Clinician: Referring Provider: Treating Provider/Extender: [name]  Weeks in Treatment: 1 Subjective Chief Complaint Information obtained from Patient Skin tear right leg History of Present Illness (HPI) Chronic/Inactive Condition: 10-02-2022 patient's ABI on the office today was 1.31 on the right on screening. 10-02-2022 upon evaluation today patient presents for initial inspection here in our clinic concerning issues that she has been having with a wound over the right anterior lower extremity. This occurred on September 08, 2022. She states this happened on Labor Day she did not go to the ER. This is a laceration which unfortunately has opened further at this point. There does appear to be some need for sharp debridement to clearway necrotic debris. We are definitely can work on this and try to see what we can do about getting this moving in the right direction much more effectively and quickly. Patient does have a history of hypertension along with vascular dementia. Unfortunately she takes most dressings off that are put in place and I think that is going to be the biggest issue here. 10-09-2022 upon evaluation today patient appears to be doing well  currently in regard to her wound which is actually measuring better and looking better as well today. Fortunately I do not see any signs of active infection which is good news. Unfortunately the biggest issue here is we have been having trouble getting her to keep her dressing in place and intact. With the dementia she is consistently taking this off unfortunately. [address] TELIA, Jeanne Stewart ([id number]) [id number] Page 5 of 6 Constitutional Well-nourished and well-hydrated in no acute distress. Vitals Time Taken: 1:56 PM, Height: 60 in, Weight: 130 lbs, BMI: 25.4, Temperature: 98 F, Pulse: 67 bpm, Respiratory Rate: 18 breaths/min, Blood Pressure: 131/60 mmHg. Respiratory normal breathing without difficulty. Psychiatric this patient is able to make decisions and demonstrates good insight into disease process. Alert and Oriented x 3. pleasant and cooperative. General Notes: Patient's wound did not require sharp debridement and actually appears to be quite healthy and looking quite well and very pleased with how things are here and I am hopeful we can continue with the plan currently. Integumentary (Hair, Skin) Wound #1 status is Open. Original cause of wound was Skin T ear/Laceration. The date acquired was: 09/08/2022. The wound has been in treatment 1 weeks. The wound is located on the Right Lower Leg. The wound measures 5.2cm length x 1.8cm width x 0.4cm depth; 7.351cm^2 area and 2.941cm^3 volume. There is Fat Layer (Subcutaneous Tissue) exposed. There is no tunneling or undermining noted. There is a medium amount of serosanguineous drainage noted. There is medium (34-66%) red granulation within the wound bed. There is a medium (34-66%) amount of necrotic tissue within the wound bed including Adherent Slough. Assessment Active Problems ICD-10 Laceration without foreign body, right lower leg, initial encounter Non-pressure chronic ulcer of other part of right lower  leg with fat layer exposed Essential (primary) hypertension Vascular dementia, mild, with other behavioral disturbance Plan Follow-up Appointments: Return Appointment in 1 week. Bathing/ Shower/ Hygiene: May shower with wound dressing protected with water repellent cover or cast protector. No tub bath. Anesthetic (Use 'Patient Medications' Section for Anesthetic Order Entry): Lidocaine applied to wound bed Edema Control - Lymphedema / Segmental Compressive Device / Other: Elevate, Exercise Daily and Avoid Standing for Long Periods of Time. Elevate leg(s) parallel to the floor when sitting. DO YOUR BEST to sleep in the bed at night. DO NOT sleep in your recliner. Long hours of sitting in a recliner leads to swelling of the legs and/or potential  wounds on your backside. WOUND #1: - Lower Leg Wound Laterality: Right Cleanser: Byram Ancillary Kit - 15 Day Supply (Generic) 3 x Per Week/15 Days Discharge Instructions: Use supplies as instructed; Kit contains: (15) Saline Bullets; (15) 3x3 Gauze; 15 pr Gloves Cleanser: Soap and Water 3 x Per Week/15 Days Discharge Instructions: Gently cleanse wound with antibacterial soap, rinse and pat dry prior to dressing wounds Cleanser: Wound Cleanser 3 x Per Week/15 Days Discharge Instructions: Wash your hands with soap and water. Remove old dressing, discard into plastic bag and place into trash. Cleanse the wound with Wound Cleanser prior to applying a clean dressing using gauze sponges, not tissues or cotton balls. Do not scrub or use excessive force. Pat dry using gauze sponges, not tissue or cotton balls. Prim Dressing: Hydrofera Blue Ready Transfer Foam, 2.5x2.5 (in/in) (Generic) 3 x Per Week/15 Days ary Discharge Instructions: placed small piece in deeper area, than larger piece over top Apply Hydrofera Blue Ready to wound bed as directed Secondary Dressing: (BORDER) Zetuvit Plus SILICONE BORDER Dressing 5x5 (in/in) (Generic) 3 x Per Week/15  Days Discharge Instructions: Please do not put silicone bordered dressings under wraps. Use non-bordered dressing only. Secured With: Tubigrip Size D, 3x10 (in/yd) 3 x Per Week/15 Days 1. I would recommend that we continue with the [facility] which I think is still doing a good job here. 2. I am going to recommend as well that the patient continues to monitor for any signs of infection or worsening. Based on what I am seeing I do believe that the Tubigrip is doing well for her although it still not doing ideal the wrap would be much better but she took that off the same night we put it on unfortunately. The dementia is definitely a hindrance here we will get a do the best we can. We will see patient back for reevaluation in 1 week here in the clinic. If anything worsens or changes patient will contact our office for additional recommendations. Electronic Signature(s) Signed: 10/09/2022 5:44:24 PM By: Allen Derry PA-C Entered By: Allen Derry on 10/09/2022 14:44:24 Jeanne Stewart ([id number]) [id number] Page 6 of 6 -------------------------------------------------------------------------------- SuperBill Details Patient Name: Date of Service: Jeanne Stewart, Jeanne Stewart 10/09/2022 Medical Record Number: [id number] Patient Account Number: [id number] Date of Birth/Sex: Treating RN: 1940-02-14 (82 y.o. Jeanne Stewart Primary Care Provider: [name] Other Clinician: Referring Provider: Treating Provider/Extender: [name] Weeks in Treatment: 1 Diagnosis Coding ICD-10 Codes Code Description [phone] Laceration without foreign body, right lower leg, initial encounter L97.812 Non-pressure chronic ulcer of other part of right lower leg with fat layer exposed I10 Essential (primary) hypertension F01.A18 Vascular dementia, mild, with other behavioral disturbance Facility Procedures : CPT4 Code: [id number] Description: 99213 - WOUND CARE  VISIT-LEV 3 EST PT Modifier: Quantity: 1 Physician Procedures : CPT4 Code Description Modifier [id number] - WC PHYS LEVEL 3 - EST PT ICD-10 Diagnosis Description S81.811A Laceration without foreign body, right lower leg, initial encounter L97.812 Non-pressure chronic ulcer of other part of right lower leg with  fat layer exposed I10 Essential (primary) hypertension F01.A18 Vascular dementia, mild, with other behavioral disturbance Quantity: 1 Electronic Signature(s) Signed: 10/09/2022 5:44:44 PM By: Allen Derry PA-C Previous Signature: 10/09/2022 4:54:50 PM Version By: Angelina Pih Entered By: Allen Derry on 10/09/2022 14:44:44

## 2022-10-09 NOTE — Progress Notes (Signed)
Jeanne Stewart ([id number]) [id number] Page 1 of 9 Visit Report for 10/09/2022 Arrival Information Details Patient Name: Date of Service: Jeanne Stewart. 10/09/2022 2:00 PM Medical Record Number: [id number] Patient Account Number: [id number] Date of Birth/Sex: Treating RN: [birth year] (82 y.o. Jeanne Stewart Other Clinician: Referring Jeanne Stewart: Treating Jeanne Stewart/Extender: Jeanne Stewart Weeks in Treatment: 1 Visit Information History Since Last Visit Added or deleted any medications: No Patient Arrived: Ambulatory Any new allergies or adverse reactions: No Arrival Time: 13:56 Had a fall or experienced change in No Accompanied By: self/son in WR activities of daily living that may affect Transfer Assistance: None risk of falls: Patient Identification Verified: Yes Hospitalized since last visit: No Secondary Verification Process Completed: Yes Has Dressing in Place as Prescribed: Yes Has Compression in Place as Prescribed: Yes Pain Present Now: No Electronic Signature(s) Signed: 10/09/2022 4:54:50 PM By: Jeanne Stewart Entered By: Jeanne Stewart on 10/09/2022 10:56:43 -------------------------------------------------------------------------------- Clinic Level of Care Assessment Details Patient Name: Date of Service: Jeanne Stewart, Jeanne Stewart. 10/09/2022 2:00 PM Medical Record Number: [id number] Patient Account Number: [id number] Date of Birth/Sex: Treating RN: 1940-12-09 (82 y.o. Jeanne Stewart Primary Care Jeanne Stewart: Jeanne Stewart Other Clinician: Referring Jeanne Stewart: Treating Jeanne Stewart Weeks in Treatment: 1 Clinic Level of Care Assessment Items TOOL 4 Quantity Score []  - 0 Use when only an EandM is performed on FOLLOW-UP visit ASSESSMENTS - Nursing Assessment / Reassessment X- 1 10 Reassessment of Co-morbidities (includes updates in patient status) X- 1  5 Reassessment of Adherence to Treatment Plan ASSESSMENTS - Wound and Skin A ssessment / Reassessment X - Simple Wound Assessment / Reassessment - one wound 1 5 Jeanne Stewart, Jeanne Stewart ([id number]) [id number] Page 2 of 9 []  - 0 Complex Wound Assessment / Reassessment - multiple wounds []  - 0 Dermatologic / Skin Assessment (not related to wound area) ASSESSMENTS - Focused Assessment []  - 0 Circumferential Edema Measurements - multi extremities []  - 0 Nutritional Assessment / Counseling / Intervention []  - 0 Lower Extremity Assessment (monofilament, tuning fork, pulses) []  - 0 Peripheral Arterial Disease Assessment (using hand held doppler) ASSESSMENTS - Ostomy and/or Continence Assessment and Care []  - 0 Incontinence Assessment and Management []  - 0 Ostomy Care Assessment and Management (repouching, etc.) PROCESS - Coordination of Care X - Simple Patient / Family Education for ongoing care 1 15 []  - 0 Complex (extensive) Patient / Family Education for ongoing care X- 1 10 Staff obtains Chiropractor, Records, T Results / Process Orders est []  - 0 Staff telephones HHA, Nursing Homes / Clarify orders / etc []  - 0 Routine Transfer to another Facility (non-emergent condition) []  - 0 Routine Hospital Admission (non-emergent condition) []  - 0 New Admissions / Manufacturing engineer / Ordering NPWT Apligraf, etc. , []  - 0 Emergency Hospital Admission (emergent condition) X- 1 10 Simple Discharge Coordination []  - 0 Complex (extensive) Discharge Coordination PROCESS - Special Needs []  - 0 Pediatric / Minor Patient Management []  - 0 Isolation Patient Management []  - 0 Hearing / Language / Visual special needs []  - 0 Assessment of Community assistance (transportation, D/C planning, etc.) []  - 0 Additional assistance / Altered mentation []  - 0 Support Surface(s) Assessment (bed, cushion, seat, etc.) INTERVENTIONS - Wound Cleansing / Measurement X -  Simple Wound Cleansing - one wound 1 5 []  - 0 Complex Wound Cleansing - multiple wounds X- 1 5 Wound Imaging (photographs - any number of wounds) []  - 0 Wound Tracing (  instead of photographs) X- 1 5 Simple Wound Measurement - one wound []  - 0 Complex Wound Measurement - multiple wounds INTERVENTIONS - Wound Dressings X - Small Wound Dressing one or multiple wounds 1 10 []  - 0 Medium Wound Dressing one or multiple wounds []  - 0 Large Wound Dressing one or multiple wounds X- 1 5 Application of Medications - topical []  - 0 Application of Medications - injection INTERVENTIONS - Miscellaneous []  - 0 External ear exam []  - 0 Specimen Collection (cultures, biopsies, blood, body fluids, etc.) []  - 0 Specimen(s) / Culture(s) sent or taken to Lab for analysis Jeanne Stewart, Jeanne Stewart ([id number]) [id number] Page 3 of 9 []  - 0 Patient Transfer (multiple staff / Nurse, adult / Similar devices) []  - 0 Simple Staple / Suture removal (25 or less) []  - 0 Complex Staple / Suture removal (26 or more) []  - 0 Hypo / Hyperglycemic Management (close monitor of Blood Glucose) []  - 0 Ankle / Brachial Index (ABI) - do not check if billed separately X- 1 5 Vital Signs Has the patient been seen at the hospital within the last three years: Yes Total Score: 90 Level Of Care: New/Established - Level 3 Electronic Signature(s) Signed: 10/09/2022 4:54:50 PM By: Jeanne Stewart Entered By: Jeanne Stewart on 10/09/2022 11:31:06 -------------------------------------------------------------------------------- Encounter Discharge Information Details Patient Name: Date of Service: Jeanne Stewart, Jeanne Stewart. 10/09/2022 2:00 PM Medical Record Number: [id number] Patient Account Number: [id number] Date of Birth/Sex: Treating RN: 08-04-1940 (82 y.o. Jeanne Stewart Primary Care Jeanne Stewart: Jeanne Stewart Other Clinician: Referring Elberta Lachapelle: Treating Oreoluwa Aigner/Extender: Jeanne Stewart Weeks in Treatment: 1 Encounter Discharge Information Items Discharge Condition: Stable Ambulatory Status: Ambulatory Discharge Destination: Home Transportation: Private Auto Accompanied By: family Schedule Follow-up Appointment: Yes Clinical Summary of Care: Electronic Signature(s) Signed: 10/09/2022 4:54:50 PM By: Jeanne Stewart Entered By: Jeanne Stewart on 10/09/2022 11:32:03 -------------------------------------------------------------------------------- Lower Extremity Assessment Details Patient Name: Date of Service: Jeanne Stewart, Jeanne Stewart. 10/09/2022 2:00 PM Medical Record Number: [id number] Patient Account Number: [id number] Date of Birth/Sex: Treating RN: May 28, 1940 (82 y.o. Jeanne Stewart Primary Care Nikki Glanzer: Jeanne Stewart Other Clinician: FELICA, Jeanne Stewart ([id number]) [id number] Page 4 of 9 Referring Tahlia Deamer: Treating Osborne Serio/Extender: Jeanne Stewart Weeks in Treatment: 1 Edema Assessment Assessed: [Left: No] [Right: No] Edema: [Left: Ye] [Right: s] Calf Left: Right: Point of Measurement: 30 cm From Medial Instep 32.8 cm Ankle Left: Right: Point of Measurement: 11 cm From Medial Instep 20 cm Vascular Assessment Pulses: Dorsalis Pedis Palpable: [Right:Yes] Posterior Tibial Palpable: [Right:Yes] Extremity colors, hair growth, and conditions: Extremity Color: [Right:Red] Hair Growth on Extremity: [Right:No] Temperature of Extremity: [Right:Cool > 3 seconds] Toe Nail Assessment Left: Right: Thick: Yes Discolored: Yes Deformed: Yes Improper Length and Hygiene: Yes Electronic Signature(s) Signed: 10/09/2022 4:54:50 PM By: Jeanne Stewart Entered By: Jeanne Stewart on 10/09/2022 11:01:40 -------------------------------------------------------------------------------- Multi Wound Chart Details Patient Name: Date of Service: Jeanne Stewart, Jeanne Stewart. 10/09/2022 2:00 PM Medical Record Number: [id number] Patient  Account Number: [id number] Date of Birth/Sex: Treating RN: [birth year] (82 y.o. Jeanne Stewart Primary Care Lessie Manigo: Jeanne Stewart Other Clinician: Referring Keerat Denicola: Treating Kamrin Sibley/Extender: Jeanne Stewart Weeks in Treatment: 1 Vital Signs Height(in): 60 Pulse(bpm): 67 Weight(lbs): 130 Blood Pressure(mmHg): 131/60 Body Mass Index(BMI): 25.4 Temperature(F): 98 Respiratory Rate(breaths/min): 18 [1:Photos:] Jeanne Stewart, Jeanne Stewart ([id number]) [1:Photos:] [N/A:N/A] Right Lower Leg N/A N/A Wound Location: Skin Tear/Laceration N/A N/A Wounding Event: Skin Tear N/A N/A Primary Etiology: Asthma, Hypertension, Dementia N/A N/A Comorbid History: 09/08/2022 N/A N/A Date Acquired:  1 N/A N/A Weeks of Treatment: Open N/A N/A Wound Status: No N/A N/A Wound Recurrence: 5.2x1.8x0.4 N/A N/A Measurements L x W x D (cm) 7.351 N/A N/A A (cm) : rea 2.941 N/A N/A Volume (cm) : 22.60% N/A N/A % Reduction in A rea: 55.80% N/A N/A % Reduction in Volume: Full Thickness Without Exposed N/A N/A Classification: Support Structures Medium N/A N/A Exudate A mount: Serosanguineous N/A N/A Exudate Type: red, brown N/A N/A Exudate Color: Medium (34-66%) N/A N/A Granulation A mount: Red N/A N/A Granulation Quality: Medium (34-66%) N/A N/A Necrotic A mount: Fat Layer (Subcutaneous Tissue): Yes N/A N/A Exposed Structures: None N/A N/A Epithelialization: Treatment Notes Electronic Signature(s) Signed: 10/09/2022 4:54:50 PM By: Jeanne Stewart Entered By: Jeanne Stewart on 10/09/2022 11:28:40 -------------------------------------------------------------------------------- Multi-Disciplinary Care Plan Details Patient Name: Date of Service: Jeanne Stewart, Jeanne Stewart. 10/09/2022 2:00 PM Medical Record Number: [id number] Patient Account Number: [id number] Date of Birth/Sex: Treating RN: [birth year] (82 y.o. Jeanne Stewart Primary Care Zaynah Chawla: Jeanne Stewart Other  Clinician: Referring Piedad Standiford: Treating Kenda Kloehn/Extender: Jeanne Stewart Weeks in Treatment: 1 Active Inactive Necrotic Tissue Nursing Diagnoses: Impaired tissue integrity related to necrotic/devitalized tissue Knowledge deficit related to management of necrotic/devitalized tissue Goals: Necrotic/devitalized tissue will be minimized in the wound bed Date Initiated: 10/02/2022 Target Resolution Date: 11/27/2022 Goal Status: Active Patient/caregiver will verbalize understanding of reason and process for debridement of necrotic tissue Jeanne Stewart, Jeanne Stewart ([id number]) [id number] Page 6 of 9 Date Initiated: 10/02/2022 Date Inactivated: 10/02/2022 Target Resolution Date: 10/02/2022 Goal Status: Met Interventions: Assess patient pain level pre-, during and post procedure and prior to discharge Provide education on necrotic tissue and debridement process Treatment Activities: Apply topical anesthetic as ordered : 10/02/2022 Excisional debridement : 10/02/2022 Notes: Wound/Skin Impairment Nursing Diagnoses: Impaired tissue integrity Knowledge deficit related to ulceration/compromised skin integrity Goals: Ulcer/skin breakdown will have a volume reduction of 30% by week 4 Date Initiated: 10/02/2022 Target Resolution Date: 10/30/2022 Goal Status: Active Ulcer/skin breakdown will have a volume reduction of 50% by week 8 Date Initiated: 10/02/2022 Target Resolution Date: 11/27/2022 Goal Status: Active Ulcer/skin breakdown will have a volume reduction of 80% by week 12 Date Initiated: 10/02/2022 Target Resolution Date: 12/25/2022 Goal Status: Active Ulcer/skin breakdown will heal within 14 weeks Date Initiated: 10/02/2022 Target Resolution Date: 01/08/2023 Goal Status: Active Interventions: Assess patient/caregiver ability to obtain necessary supplies Assess patient/caregiver ability to perform ulcer/skin care regimen upon admission and as needed Assess  ulceration(s) every visit Provide education on ulcer and skin care Treatment Activities: Skin care regimen initiated : 10/02/2022 Notes: Electronic Signature(s) Signed: 10/09/2022 4:54:50 PM By: Jeanne Stewart Entered By: Jeanne Stewart on 10/09/2022 11:31:22 -------------------------------------------------------------------------------- Pain Assessment Details Patient Name: Date of Service: Jeanne Stewart, Jeanne Stewart. 10/09/2022 2:00 PM Medical Record Number: [id number] Patient Account Number: [id number] Date of Birth/Sex: Treating RN: [birth year] (82 y.o. Jeanne Stewart Primary Care Liliauna Santoni: Jeanne Stewart Other Clinician: Referring Islam Eichinger: Treating Shaylee Stanislawski/Extender: Jeanne Stewart Weeks in Treatment: 1 Active Problems Location of Pain Severity and Description of Pain Patient Has Paino No Site Locations Rate the pain. Jeanne Stewart, Jeanne Stewart ([id number]) [id number] Page 7 of 9 Rate the pain. Current Pain Level: 0 Pain Management and Medication Current Pain Management: Electronic Signature(s) Signed: 10/09/2022 4:54:50 PM By: Jeanne Stewart Entered By: Jeanne Stewart on 10/09/2022 10:57:30 -------------------------------------------------------------------------------- Patient/Caregiver Education Details Patient Name: Date of Service: Jeanne Stewart 10/3/2024andnbsp2:00 PM Medical Record Number: [id number] Patient Account Number: [id number] Date of Birth/Gender: Treating RN: [birth year] (82 y.o. Philbert Riser, Luther Parody  Primary Care Physician: Jeanne Stewart Other Clinician: Referring Physician: Treating Physician/Extender: Mariana Arn in Treatment: 1 Education Assessment Education Provided To: Patient Education Topics Provided Wound/Skin Impairment: Handouts: Caring for Your Ulcer Methods: Explain/Verbal Responses: State content correctly Electronic Signature(s) Signed: 10/09/2022 4:54:50 PM By: Jeanne Stewart Entered By: Jeanne Stewart on 10/09/2022 11:31:31 Jeanne Stewart ([id number]) [id number] Page 8 of 9 -------------------------------------------------------------------------------- Wound Assessment Details Patient Name: Date of Service: Jeanne Stewart, Jeanne Stewart. 10/09/2022 2:00 PM Medical Record Number: [id number] Patient Account Number: [id number] Date of Birth/Sex: Treating RN: 1940/09/27 (82 y.o. Jeanne Stewart Primary Care Smayan Hackbart: Jeanne Stewart Other Clinician: Referring Sitlaly Gudiel: Treating Marcos Ruelas/Extender: Jeanne Stewart Weeks in Treatment: 1 Wound Status Wound Number: 1 Primary Etiology: Skin Tear Wound Location: Right Lower Leg Wound Status: Open Wounding Event: Skin Tear/Laceration Comorbid History: Asthma, Hypertension, Dementia Date Acquired: 09/08/2022 Weeks Of Treatment: 1 Clustered Wound: No Photos Wound Measurements Length: (cm) 5.2 Width: (cm) 1.8 Depth: (cm) 0.4 Area: (cm) 7.351 Volume: (cm) 2.941 % Reduction in Area: 22.6% % Reduction in Volume: 55.8% Epithelialization: None Tunneling: No Undermining: No Wound Description Classification: Full Thickness Without Exposed Support Structures Exudate Amount: Medium Exudate Type: Serosanguineous Exudate Color: red, brown Foul Odor After Cleansing: No Slough/Fibrino Yes Wound Bed Granulation Amount: Medium (34-66%) Exposed Structure Granulation Quality: Red Fat Layer (Subcutaneous Tissue) Exposed: Yes Necrotic Amount: Medium (34-66%) Necrotic Quality: Adherent Slough Treatment Notes Wound #1 (Lower Leg) Wound Laterality: Right Cleanser [place]ram Ancillary Kit - 15 Day Supply Discharge Instruction: Use supplies as instructed; Kit contains: (15) Saline Bullets; (15) 3x3 Gauze; 15 pr Gloves Soap and Water Discharge Instruction: Gently cleanse wound with antibacterial soap, rinse and pat dry prior to dressing wounds Wound Cleanser Discharge Instruction: Wash  your hands with soap and water. Remove old dressing, discard into plastic bag and place into trash. Cleanse the KIMBALL, MANSKE ([id number]) [id number] Page 9 of 9 wound with Wound Cleanser prior to applying a clean dressing using gauze sponges, not tissues or cotton balls. Do not scrub or use excessive force. Pat dry using gauze sponges, not tissue or cotton balls. Peri-Wound Care Topical Primary Dressing Hydrofera Blue Ready Transfer Foam, 2.5x2.5 (in/in) Discharge Instruction: Apply Hydrofera Blue Ready to wound bed as directed Secondary Dressing (BORDER) Zetuvit Plus SILICONE BORDER Dressing 5x5 (in/in) Discharge Instruction: Please do not put silicone bordered dressings under wraps. Use non-bordered dressing only. Secured With Tubigrip Size D, 3x10 (in/yd) Compression Wrap Compression Stockings Add-Ons Electronic Signature(s) Signed: 10/09/2022 4:54:50 PM By: Jeanne Stewart Entered By: Jeanne Stewart on 10/09/2022 11:04:32 -------------------------------------------------------------------------------- Vitals Details Patient Name: Date of Service: Jeanne Stewart, Xylina M. 10/09/2022 2:00 PM Medical Record Number: [id number] Patient Account Number: [id number] Date of Birth/Sex: Treating RN: 07/30/1940 (82 y.o. Jeanne Stewart Primary Care Djuan Talton: Jeanne Stewart Other Clinician: Referring Atoya Andrew: Treating Babara Buffalo/Extender: Jeanne Stewart Weeks in Treatment: 1 Vital Signs Time Taken: 13:56 Temperature (F): 98 Height (in): 60 Pulse (bpm): 67 Weight (lbs): 130 Respiratory Rate (breaths/min): 18 Body Mass Index (BMI): 25.4 Blood Pressure (mmHg): 131/60 Reference Range: 80 - 120 mg / dl Electronic Signature(s) Signed: 10/09/2022 4:54:50 PM By: Jeanne Stewart Entered By: Jeanne Stewart on 10/09/2022 10:57:09

## 2022-10-16 ENCOUNTER — Encounter: Payer: Medicare HMO | Admitting: Physician Assistant

## 2022-10-16 DIAGNOSIS — S81811A Laceration without foreign body, right lower leg, initial encounter: Secondary | ICD-10-CM | POA: Diagnosis not present

## 2022-10-16 NOTE — Progress Notes (Addendum)
Jeanne Stewart, Jeanne Stewart ([id number]) [id number] Page 1 of 6 Visit Report for 10/16/2022 Chief Complaint Document Details Patient Name: Date of Service: Jeanne Stewart, Jeanne Stewart 10/16/2022 2:45 PM Medical Record Number: [id number] Patient Account Number: [id number] Date of Birth/Sex: Treating RN: Jan 08, 1940 (82 y.o. Jeanne Stewart Primary Care Provider: [name] Other Clinician: [name] Referring Provider: Treating Provider/Extender: [name] Weeks in Treatment: 2 Information Obtained from: Patient Chief Complaint Skin tear right leg Electronic Signature(s) Signed: 10/16/2022 2:34:49 PM By: Allen Derry PA-C Entered By: Allen Derry on 10/16/2022 14:34:49 -------------------------------------------------------------------------------- HPI Details Patient Name: Date of Service: Jeanne Stewart, Jeanne Stewart. 10/16/2022 2:45 PM Medical Record Number: [id number] Patient Account Number: [id number] Date of Birth/Sex: Treating RN: January 06, 1941 (82 y.o. Jeanne Stewart Primary Care Provider: [name] Other Clinician: [name] Referring Provider: Treating Provider/Extender: [name] Weeks in Treatment: 2 History of Present Illness Chronic/Inactive Conditions Condition 1: 10-02-2022 patient's ABI on the office today was 1.31 on the right on screening. HPI Description: 10-02-2022 upon evaluation today patient presents for initial inspection here in our clinic concerning issues that she has been having with a wound over the right anterior lower extremity. This occurred on September 08, 2022. She states this happened on Labor Day she did not go to the ER. This is a laceration which unfortunately has opened further at this point. There does appear to be some need for sharp debridement to clearway necrotic debris. We are definitely can work on this and try to see what we can do about getting this moving in the right direction  much more effectively and quickly. Patient does have a history of hypertension along with vascular dementia. Unfortunately she takes most dressings off that are put in place and I think that is going to be the biggest issue here. 10-09-2022 upon evaluation today patient appears to be doing well currently in regard to her wound which is actually measuring better and looking better as well today. Fortunately I do not see any signs of active infection which is good news. Unfortunately the biggest issue here is we have been having trouble getting her to keep her dressing in place and intact. With the dementia she is consistently taking this off unfortunately. 10-16-2022 upon evaluation today patient appears to be doing well currently in regard to her wound. She has been tolerating the dressing changes the Hydrofera Blue fortunately there does not appear to be any signs of infection and she seems to be doing quite well. Jeanne Stewart, Jeanne Stewart ([id number]) [id number] Page 2 of 6 Electronic Signature(s) Signed: 10/16/2022 3:17:29 PM By: Allen Derry PA-C Entered By: Allen Derry on 10/16/2022 15:17:29 -------------------------------------------------------------------------------- Physical Exam Details Patient Name: Date of Service: Jeanne Stewart, Jeanne Stewart 10/16/2022 2:45 PM Medical Record Number: [id number] Patient Account Number: [id number] Date of Birth/Sex: Treating RN: 1940/06/03 (82 y.o. Jeanne Stewart Primary Care Provider: [name] Other Clinician: [name] Referring Provider: Treating Provider/Extender: [name] Weeks in Treatment: 2 Constitutional Well-nourished and well-hydrated in no acute distress. Respiratory normal breathing without difficulty. Psychiatric this patient is able to make decisions and demonstrates good insight into disease process. Alert and Oriented x 3. pleasant and cooperative. Notes Based on what I am seeing I  do believe the patient is doing extremely well with the [facility] I am actually extremely pleased with where we stand I think that this is doing an awesome job at helping the wound heal appropriately. Electronic Signature(s) Signed: 10/16/2022 3:17:47 PM By:  Allen Derry PA-C Entered By: Allen Derry on 10/16/2022 15:17:46 -------------------------------------------------------------------------------- Physician Orders Details Patient Name: Date of Service: Jeanne Stewart, Jeanne Stewart 10/16/2022 2:45 PM Medical Record Number: [id number] Patient Account Number: [id number] Date of Birth/Sex: Treating RN: 1940-12-27 (82 y.o. Jeanne Stewart Primary Care Provider: [name] Other Clinician: [name] Referring Provider: Treating Provider/Extender: [name] Weeks in Treatment: 2 Verbal / Phone Orders: Yes Clinician: Angelina Pih Read Back and Verified: Yes Diagnosis Coding ICD-10 Coding Code Description [phone] Laceration without foreign body, right lower leg, initial encounter Jeanne Stewart, Jeanne Stewart ([id number]) [phone].pdf Page 3 of 6 [phone] Non-pressure chronic ulcer of other part of right lower leg with fat layer exposed I10 Essential (primary) hypertension F01.A18 Vascular dementia, mild, with other behavioral disturbance Follow-up Appointments Return Appointment in 1 week. Bathing/ Shower/ Hygiene May shower with wound dressing protected with water repellent cover or cast protector. No tub bath. Anesthetic (Use 'Patient Medications' Section for Anesthetic Order Entry) Lidocaine applied to wound bed Edema Control - Lymphedema / Segmental Compressive Device / Other Elevate, Exercise Daily and A void Standing for Long Periods of Time. Elevate leg(s) parallel to the floor when sitting. DO YOUR BEST to sleep in the bed at night. DO NOT sleep in your recliner. Long hours of sitting in a recliner leads to swelling of the  legs and/or potential wounds on your backside. Wound Treatment Wound #1 - Lower Leg Wound Laterality: Right Cleanser: Byram Ancillary Kit - 15 Day Supply (Generic) 3 x Per Week/15 Days Discharge Instructions: Use supplies as instructed; Kit contains: (15) Saline Bullets; (15) 3x3 Gauze; 15 pr Gloves Cleanser: Soap and Water 3 x Per Week/15 Days Discharge Instructions: Gently cleanse wound with antibacterial soap, rinse and pat dry prior to dressing wounds Cleanser: Wound Cleanser 3 x Per Week/15 Days Discharge Instructions: Wash your hands with soap and water. Remove old dressing, discard into plastic bag and place into trash. Cleanse the wound with Wound Cleanser prior to applying a clean dressing using gauze sponges, not tissues or cotton balls. Do not scrub or use excessive force. Pat dry using gauze sponges, not tissue or cotton balls. Prim Dressing: Hydrofera Blue Ready Transfer Foam, 2.5x2.5 (in/in) (Generic) 3 x Per Week/15 Days ary Discharge Instructions: placed small piece in deeper area, than larger piece over top Apply Hydrofera Blue Ready to wound bed as directed Secondary Dressing: (BORDER) Zetuvit Plus SILICONE BORDER Dressing 5x5 (in/in) (Generic) 3 x Per Week/15 Days Discharge Instructions: Please do not put silicone bordered dressings under wraps. Use non-bordered dressing only. Secured With: Tubigrip Size D, 3x10 (in/yd) 3 x Per Week/15 Days Electronic Signature(s) Signed: 10/16/2022 4:17:03 PM By: Allen Derry PA-C Signed: 10/16/2022 5:04:18 PM By: [name] Entered By: [name] on 10/16/2022 15:08:27 -------------------------------------------------------------------------------- Problem List Details Patient Name: Date of Service: Jeanne Stewart, Jeanne Stewart. 10/16/2022 2:45 PM Medical Record Number: [id number] Patient Account Number: [id number] Date of Birth/Sex: Treating RN: 1940-08-14 (81 y.o. Jeanne Stewart Primary Care Provider: [name] Other  Clinician: [name] Referring Provider: Treating Provider/Extender: [name] Weeks in Treatment: 2 Active Problems ICD-10 Encounter Code Description Active Date MDM Diagnosis MYKIAH, MAUNG ([id number]) [id number] Page 4 of 6 [phone] Laceration without foreign body, right lower leg, initial encounter 10/02/2022 No Yes L97.812 Non-pressure chronic ulcer of other part of right lower leg with fat layer 10/02/2022 No Yes exposed I10 Essential (primary) hypertension 10/02/2022 No Yes F01.A18 Vascular dementia, mild, with other behavioral disturbance 10/02/2022 No Yes Inactive Problems  Resolved Problems Electronic Signature(s) Signed: 10/16/2022 2:34:44 PM By: Allen Derry PA-C Entered By: Allen Derry on 10/16/2022 14:34:44 -------------------------------------------------------------------------------- Progress Note Details Patient Name: Date of Service: Jeanne Arbour. 10/16/2022 2:45 PM Medical Record Number: [id number] Patient Account Number: [id number] Date of Birth/Sex: Treating RN: [birth year] (82 y.o. Jeanne Stewart Primary Care Provider: [name] Other Clinician: [name] Referring Provider: Treating Provider/Extender: [name] Weeks in Treatment: 2 Subjective Chief Complaint Information obtained from Patient Skin tear right leg History of Present Illness (HPI) Chronic/Inactive Condition: 10-02-2022 patient's ABI on the office today was 1.31 on the right on screening. 10-02-2022 upon evaluation today patient presents for initial inspection here in our clinic concerning issues that she has been having with a wound over the right anterior lower extremity. This occurred on September 08, 2022. She states this happened on Labor Day she did not go to the ER. This is a laceration which unfortunately has opened further at this point. There does appear to be some need for sharp debridement to  clearway necrotic debris. We are definitely can work on this and try to see what we can do about getting this moving in the right direction much more effectively and quickly. Patient does have a history of hypertension along with vascular dementia. Unfortunately she takes most dressings off that are put in place and I think that is going to be the biggest issue here. 10-09-2022 upon evaluation today patient appears to be doing well currently in regard to her wound which is actually measuring better and looking better as well today. Fortunately I do not see any signs of active infection which is good news. Unfortunately the biggest issue here is we have been having trouble getting her to keep her dressing in place and intact. With the dementia she is consistently taking this off unfortunately. 10-16-2022 upon evaluation today patient appears to be doing well currently in regard to her wound. She has been tolerating the dressing changes the Hydrofera Blue fortunately there does not appear to be any signs of infection and she seems to be doing quite well. Jeanne Stewart, Jeanne Stewart ([id number]) [id number] Page 5 of 6 Objective Constitutional Well-nourished and well-hydrated in no acute distress. Vitals Time Taken: 2:50 PM, Height: 60 in, Weight: 130 lbs, BMI: 25.4, Temperature: 98.4 F, Pulse: 76 bpm, Respiratory Rate: 18 breaths/min, Blood Pressure: 125/81 mmHg. Respiratory normal breathing without difficulty. Psychiatric this patient is able to make decisions and demonstrates good insight into disease process. Alert and Oriented x 3. pleasant and cooperative. General Notes: Based on what I am seeing I do believe the patient is doing extremely well with the [facility] I am actually extremely pleased with where we stand I think that this is doing an awesome job at helping the wound heal appropriately. Integumentary (Hair, Skin) Wound #1 status is Open. Original cause of wound  was Skin Tear/Laceration. The date acquired was: 09/08/2022. The wound has been in treatment 2 weeks. The wound is located on the Right Lower Leg. The wound measures 3.7cm length x 1.2cm width x 0.4cm depth; 3.487cm^2 area and 1.395cm^3 volume. There is Fat Layer (Subcutaneous Tissue) exposed. There is a medium amount of serosanguineous drainage noted. There is medium (34-66%) red granulation within the wound bed. There is a medium (34-66%) amount of necrotic tissue within the wound bed including Adherent Slough. Assessment Active Problems ICD-10 Laceration without foreign body, right lower leg, initial encounter Non-pressure chronic ulcer of other part of right lower leg with  fat layer exposed Essential (primary) hypertension Vascular dementia, mild, with other behavioral disturbance Plan Follow-up Appointments: Return Appointment in 1 week. Bathing/ Shower/ Hygiene: May shower with wound dressing protected with water repellent cover or cast protector. No tub bath. Anesthetic (Use 'Patient Medications' Section for Anesthetic Order Entry): Lidocaine applied to wound bed Edema Control - Lymphedema / Segmental Compressive Device / Other: Elevate, Exercise Daily and Avoid Standing for Long Periods of Time. Elevate leg(s) parallel to the floor when sitting. DO YOUR BEST to sleep in the bed at night. DO NOT sleep in your recliner. Long hours of sitting in a recliner leads to swelling of the legs and/or potential wounds on your backside. WOUND #1: - Lower Leg Wound Laterality: Right Cleanser: Byram Ancillary Kit - 15 Day Supply (Generic) 3 x Per Week/15 Days Discharge Instructions: Use supplies as instructed; Kit contains: (15) Saline Bullets; (15) 3x3 Gauze; 15 pr Gloves Cleanser: Soap and Water 3 x Per Week/15 Days Discharge Instructions: Gently cleanse wound with antibacterial soap, rinse and pat dry prior to dressing wounds Cleanser: Wound Cleanser 3 x Per Week/15 Days Discharge  Instructions: Wash your hands with soap and water. Remove old dressing, discard into plastic bag and place into trash. Cleanse the wound with Wound Cleanser prior to applying a clean dressing using gauze sponges, not tissues or cotton balls. Do not scrub or use excessive force. Pat dry using gauze sponges, not tissue or cotton balls. Prim Dressing: Hydrofera Blue Ready Transfer Foam, 2.5x2.5 (in/in) (Generic) 3 x Per Week/15 Days ary Discharge Instructions: placed small piece in deeper area, than larger piece over top Apply Hydrofera Blue Ready to wound bed as directed Secondary Dressing: (BORDER) Zetuvit Plus SILICONE BORDER Dressing 5x5 (in/in) (Generic) 3 x Per Week/15 Days Discharge Instructions: Please do not put silicone bordered dressings under wraps. Use non-bordered dressing only. Secured With: Tubigrip Size D, 3x10 (in/yd) 3 x Per Week/15 Days 1. I would recommend that we have the patient continue to monitor for any evidence of infection or worsening. Based on what I am seeing I do believe that the patient is making excellent headway towards closure. 2. I am good recommend that we should continue with the bordered foam dressing to cover after applying the [facility] Blue. 3. I am also can recommend the Tubigrip although I think she keeps it for a little while eventually ends up getting cut to smaller size the dementia is definitely a concern here although she does seem to be keeping the blue with the bordered foam dressing in place which I think is doing well. We will see patient back for reevaluation in 1 week here in the clinic. If anything worsens or changes patient will contact our office for additional recommendations. Jeanne Stewart, Jeanne Stewart ([id number]) [id number] Page 6 of 6 Electronic Signature(s) Signed: 10/16/2022 3:18:21 PM By: Allen Derry PA-C Entered By: Allen Derry on 10/16/2022  15:18:21 -------------------------------------------------------------------------------- SuperBill Details Patient Name: Date of Service: Jeanne Stewart, Jeanne Stewart 10/16/2022 Medical Record Number: [id number] Patient Account Number: [id number] Date of Birth/Sex: Treating RN: April 24, 1940 (82 y.o. Jeanne Stewart Primary Care Provider: [name] Other Clinician: [name] Referring Provider: Treating Provider/Extender: [name] Weeks in Treatment: 2 Diagnosis Coding ICD-10 Codes Code Description [phone] Laceration without foreign body, right lower leg, initial encounter L97.812 Non-pressure chronic ulcer of other part of right lower leg with fat layer exposed I10 Essential (primary) hypertension F01.A18 Vascular dementia, mild, with other behavioral disturbance Facility Procedures : CPT4 Code: [id number] Description: [phone] -  WOUND CARE VISIT-LEV 3 EST PT Modifier: Quantity: 1 Physician Procedures : CPT4 Code Description Modifier [id number] - WC PHYS LEVEL 3 - EST PT ICD-10 Diagnosis Description [id number] Laceration without foreign body, right lower leg, initial encounter L97.812 Non-pressure chronic ulcer of other part of right lower leg with  fat layer exposed I10 Essential (primary) hypertension F01.A18 Vascular dementia, mild, with other behavioral disturbance Quantity: 1 Electronic Signature(s) Signed: 10/16/2022 3:19:27 PM By: Allen Derry PA-C Entered By: Allen Derry on 10/16/2022 15:19:27

## 2022-10-16 NOTE — Progress Notes (Addendum)
Jeanne Stewart ([id number]) [id number] Page 1 of 9 Visit Report for 10/16/2022 Arrival Information Details Patient Name: Date of Service: Jeanne Stewart, Jeanne Stewart 10/16/2022 2:45 PM Medical Record Number: [id number] Patient Account Number: [id number] Date of Birth/Sex: Treating RN: 02-12-1940 (82 y.o. [name] Primary Care Leonilda Cozby: [name] Other Clinician: [name] Referring Tally Mckinnon: Treating Donelle Baba/Extender: [name] Weeks in Treatment: 2 Visit Information History Since Last Visit All ordered tests and consults were completed: No Patient Arrived: Ambulatory Added or deleted any medications: No Arrival Time: 14:43 Any new allergies or adverse reactions: No Transfer Assistance: None Had a fall or experienced change in No Patient Identification Verified: Yes activities of daily living that may affect Secondary Verification Process Completed: Yes risk of falls: Patient Requires Transmission-Based Precautions: No Signs or symptoms of abuse/neglect since last visito No Patient Has Alerts: No Hospitalized since last visit: No Implantable device outside of the clinic excluding No cellular tissue based products placed in the center since last visit: Has Dressing in Place as Prescribed: Yes Pain Present Now: No Electronic Signature(s) Signed: 10/16/2022 5:04:18 PM By: [name] Entered By: [name] on 10/16/2022 11:50:14 -------------------------------------------------------------------------------- Clinic Level of Care Assessment Details Patient Name: Date of Service: Jeanne Stewart, Jeanne Stewart 10/16/2022 2:45 PM Medical Record Number: [id number] Patient Account Number: [id number] Date of Birth/Sex: Treating RN: [birth year] (82 y.o. [name] Primary Care Efe Fazzino: [name] Other Clinician: [name] Referring Tyshea Imel: Treating Judson Tsan/Extender: [name] Weeks in  Treatment: 2 Clinic Level of Care Assessment Items TOOL 4 Quantity Score []  - 0 Use when only an EandM is performed on FOLLOW-UP visit ASSESSMENTS - Nursing Assessment / Reassessment X- 1 10 Reassessment of Co-morbidities (includes updates in patient status) X- 1 5 Reassessment of Adherence to Treatment Plan Jeanne Stewart, Jeanne Stewart ([id number]) [id number] Page 2 of 9 ASSESSMENTS - Wound and Skin A ssessment / Reassessment X - Simple Wound Assessment / Reassessment - one wound 1 5 []  - 0 Complex Wound Assessment / Reassessment - multiple wounds []  - 0 Dermatologic / Skin Assessment (not related to wound area) ASSESSMENTS - Focused Assessment []  - 0 Circumferential Edema Measurements - multi extremities []  - 0 Nutritional Assessment / Counseling / Intervention []  - 0 Lower Extremity Assessment (monofilament, tuning fork, pulses) []  - 0 Peripheral Arterial Disease Assessment (using hand held doppler) ASSESSMENTS - Ostomy and/or Continence Assessment and Care []  - 0 Incontinence Assessment and Management []  - 0 Ostomy Care Assessment and Management (repouching, etc.) PROCESS - Coordination of Care X - Simple Patient / Family Education for ongoing care 1 15 []  - 0 Complex (extensive) Patient / Family Education for ongoing care []  - 0 Staff obtains Chiropractor, Records, T Results / Process Orders est []  - 0 Staff telephones HHA, Nursing Homes / Clarify orders / etc []  - 0 Routine Transfer to another Facility (non-emergent condition) []  - 0 Routine Hospital Admission (non-emergent condition) []  - 0 New Admissions / Manufacturing engineer / Ordering NPWT Apligraf, etc. , []  - 0 Emergency Hospital Admission (emergent condition) X- 1 10 Simple Discharge Coordination []  - 0 Complex (extensive) Discharge Coordination PROCESS - Special Needs []  - 0 Pediatric / Minor Patient Management []  - 0 Isolation Patient Management []  - 0 Hearing / Language /  Visual special needs []  - 0 Assessment of Community assistance (transportation, D/C planning, etc.) []  - 0 Additional assistance / Altered mentation []  - 0 Support Surface(s) Assessment (bed, cushion, seat, etc.) INTERVENTIONS - Wound Cleansing /  Measurement X - Simple Wound Cleansing - one wound 1 5 []  - 0 Complex Wound Cleansing - multiple wounds X- 1 5 Wound Imaging (photographs - any number of wounds) []  - 0 Wound Tracing (instead of photographs) X- 1 5 Simple Wound Measurement - one wound []  - 0 Complex Wound Measurement - multiple wounds INTERVENTIONS - Wound Dressings []  - 0 Small Wound Dressing one or multiple wounds X- 1 15 Medium Wound Dressing one or multiple wounds []  - 0 Large Wound Dressing one or multiple wounds []  - 0 Application of Medications - topical []  - 0 Application of Medications - injection INTERVENTIONS - Miscellaneous []  - 0 External ear exam Jeanne Stewart ([id number]) [id number] Page 3 of 9 []  - 0 Specimen Collection (cultures, biopsies, blood, body fluids, etc.) []  - 0 Specimen(s) / Culture(s) sent or taken to Lab for analysis []  - 0 Patient Transfer (multiple staff / Michiel Sites Lift / Similar devices) []  - 0 Simple Staple / Suture removal (25 or less) []  - 0 Complex Staple / Suture removal (26 or more) []  - 0 Hypo / Hyperglycemic Management (close monitor of Blood Glucose) []  - 0 Ankle / Brachial Index (ABI) - do not check if billed separately X- 1 5 Vital Signs Has the patient been seen at the hospital within the last three years: Yes Total Score: 80 Level Of Care: New/Established - Level 3 Electronic Signature(s) Signed: 10/16/2022 5:04:18 PM By: [name] Entered By: [name] on 10/16/2022 12:08:52 -------------------------------------------------------------------------------- Encounter Discharge Information Details Patient Name: Date of Service: Jeanne Stewart. 10/16/2022 2:45  PM Medical Record Number: [id number] Patient Account Number: [id number] Date of Birth/Sex: Treating RN: [birth year] (82 y.o. [name] Primary Care Myanna Ziesmer: [name] Other Clinician: [name] Referring Terriann Difonzo: Treating Zyire Eidson/Extender: [name] Weeks in Treatment: 2 Encounter Discharge Information Items Discharge Condition: Stable Ambulatory Status: Ambulatory Discharge Destination: Home Transportation: Private Auto Accompanied By: self Schedule Follow-up Appointment: Yes Clinical Summary of Care: Electronic Signature(s) Signed: 10/16/2022 5:04:18 PM By: [name] Entered By: [name] on 10/16/2022 12:20:34 Lower Extremity Assessment Details -------------------------------------------------------------------------------- Jeanne Stewart ([id number]) [id number] Page 4 of 9 Patient Name: Date of Service: KAINAT, PIZANA 10/16/2022 2:45 PM Medical Record Number: [id number] Patient Account Number: [id number] Date of Birth/Sex: Treating RN: [birth year] (82 y.o. [name] Primary Care Sam Overbeck: [name] Other Clinician: [name] Referring Baley Lorimer: Treating Asa Fath/Extender: [name] Weeks in Treatment: 2 Edema Assessment Left: Right: Assessed: No Yes Edema: Yes Calf Left: Right: Point of Measurement: 30 cm From Medial Instep 33.5 cm Ankle Left: Right: Point of Measurement: 11 cm From Medial Instep 20.3 cm Vascular Assessment Left: Right: Pulses: Dorsalis Pedis Palpable: Yes Extremity colors, hair growth, and conditions: Hair Growth on Extremity: Yes Temperature of Extremity: Warm Capillary Refill: < 3 seconds Toe Nail Assessment Left: Right: Thick: No Discolored: No Deformed: No Improper Length and Hygiene: No Electronic Signature(s) Signed: 10/16/2022 4:28:09 PM By: Angelina Pih Signed: 10/16/2022 5:04:18 PM By: [name] Entered  By: [name] on 10/16/2022 11:59:17 -------------------------------------------------------------------------------- Multi Wound Chart Details Patient Name: Date of Service: Jeanne Stewart. 10/16/2022 2:45 PM Medical Record Number: [id number] Patient Account Number: [id number] Date of Birth/Sex: Treating RN: February 22, 1940 (82 y.o. [name] Primary Care Briyah Wheelwright: [name] Other Clinician: [name] Referring Oziah Vitanza: Treating Yardley Lekas/Extender: [name] Weeks in Treatment: 2 Vital Signs Height(in): 60 Pulse(bpm): 76 Weight(lbs): 130 Blood Pressure(mmHg): 125/81 Body Mass Index(BMI): 25.4 Temperature(F):  98.4 Respiratory Rate(breaths/min): 18 [Recchia, Tavaria M ([id number]):Photos:] [phone].pdf Page 5 of 9:1 N/A N/A N/A N/A] Right Lower Leg N/A N/A Wound Location: Skin Tear/Laceration N/A N/A Wounding Event: Skin Tear N/A N/A Primary Etiology: Asthma, Hypertension, Dementia N/A N/A Comorbid History: 09/08/2022 N/A N/A Date Acquired: 2 N/A N/A Weeks of Treatment: Open N/A N/A Wound Status: No N/A N/A Wound Recurrence: 3.7x1.2x0.4 N/A N/A Measurements L x W x D (cm) 3.487 N/A N/A A (cm) : rea 1.395 N/A N/A Volume (cm) : 63.30% N/A N/A % Reduction in A rea: 79.00% N/A N/A % Reduction in Volume: Full Thickness Without Exposed N/A N/A Classification: Support Structures Medium N/A N/A Exudate A mount: Serosanguineous N/A N/A Exudate Type: red, brown N/A N/A Exudate Color: Medium (34-66%) N/A N/A Granulation A mount: Red N/A N/A Granulation Quality: Medium (34-66%) N/A N/A Necrotic A mount: Fat Layer (Subcutaneous Tissue): Yes N/A N/A Exposed Structures: None N/A N/A Epithelialization: Treatment Notes Electronic Signature(s) Signed: 10/16/2022 5:04:18 PM By: [name] Entered By: [name] on 10/16/2022  11:59:23 -------------------------------------------------------------------------------- Multi-Disciplinary Care Plan Details Patient Name: Date of Service: Jeanne Stewart. 10/16/2022 2:45 PM Medical Record Number: [id number] Patient Account Number: [id number] Date of Birth/Sex: Treating RN: [birth year] (82 y.o. [name] Primary Care Daija Routson: [name] Other Clinician: [name] Referring Faye Sanfilippo: Treating Sahasra Belue/Extender: [name] Weeks in Treatment: 2 Active Inactive Necrotic Tissue Nursing Diagnoses: Impaired tissue integrity related to necrotic/devitalized tissue Knowledge deficit related to management of necrotic/devitalized tissue Goals: Necrotic/devitalized tissue will be minimized in the wound bed Date Initiated: 10/02/2022 Target Resolution Date: 11/27/2022 Goal Status: Active EBELYN, BOHNET ([id number]) [phone].pdf Page 6 of 9 Patient/caregiver will verbalize understanding of reason and process for debridement of necrotic tissue Date Initiated: 10/02/2022 Date Inactivated: 10/02/2022 Target Resolution Date: 10/02/2022 Goal Status: Met Interventions: Assess patient pain level pre-, during and post procedure and prior to discharge Provide education on necrotic tissue and debridement process Treatment Activities: Apply topical anesthetic as ordered : 10/02/2022 Excisional debridement : 10/02/2022 Notes: Wound/Skin Impairment Nursing Diagnoses: Impaired tissue integrity Knowledge deficit related to ulceration/compromised skin integrity Goals: Ulcer/skin breakdown will have a volume reduction of 30% by week 4 Date Initiated: 10/02/2022 Target Resolution Date: 10/30/2022 Goal Status: Active Ulcer/skin breakdown will have a volume reduction of 50% by week 8 Date Initiated: 10/02/2022 Target Resolution Date: 11/27/2022 Goal Status: Active Ulcer/skin breakdown will have a volume reduction of 80% by  week 12 Date Initiated: 10/02/2022 Target Resolution Date: 12/25/2022 Goal Status: Active Ulcer/skin breakdown will heal within 14 weeks Date Initiated: 10/02/2022 Target Resolution Date: 01/08/2023 Goal Status: Active Interventions: Assess patient/caregiver ability to obtain necessary supplies Assess patient/caregiver ability to perform ulcer/skin care regimen upon admission and as needed Assess ulceration(s) every visit Provide education on ulcer and skin care Treatment Activities: Skin care regimen initiated : 10/02/2022 Notes: Electronic Signature(s) Signed: 10/16/2022 4:28:09 PM By: Angelina Pih Signed: 10/16/2022 5:04:18 PM By: [name] Entered By: [name] on 10/16/2022 12:09:02 -------------------------------------------------------------------------------- Pain Assessment Details Patient Name: Date of Service: Jeanne Stewart. 10/16/2022 2:45 PM Medical Record Number: [id number] Patient Account Number: [id number] Date of Birth/Sex: Treating RN: 08-27-1940 (82 y.o. [name] Primary Care Bernie Fobes: [name] Other Clinician: [name] Referring Kamaya Keckler: Treating Shanequa Whitenight/Extender: [name] Weeks in Treatment: 2 Active Problems Location of Pain Severity and Description of Pain Jeanne Stewart, Jeanne Stewart ([id number]) [id number] Page 7 of 9 Patient Has Paino No Site Locations Pain Management and Medication Current Pain Management: Electronic  Signature(s) Signed: 10/16/2022 4:28:09 PM By: Angelina Pih Signed: 10/16/2022 5:04:18 PM By: [name] Entered By: [name] on 10/16/2022 11:53:12 -------------------------------------------------------------------------------- Patient/Caregiver Education Details Patient Name: Date of Service: Jeanne Stewart 10/10/2024andnbsp2:45 PM Medical Record Number: [id number] Patient Account Number: [id number] Date of Birth/Gender: Treating  RN: 1940/07/01 (82 y.o. [name] Primary Care Physician: [name] Other Clinician: [name] Referring Physician: Treating Physician/Extender: [name] Weeks in Treatment: 2 Education Assessment Education Provided To: Patient Education Topics Provided Wound/Skin Impairment: Handouts: Other: continue wound care as directed Methods: Explain/Verbal Responses: State content correctly Electronic Signature(s) Signed: 10/16/2022 5:04:18 PM By: [name] Entered By: [name] on 10/16/2022 12:20:00 Jeanne Stewart ([id number]) [id number] Page 8 of 9 -------------------------------------------------------------------------------- Wound Assessment Details Patient Name: Date of Service: Jeanne Stewart, Jeanne Stewart 10/16/2022 2:45 PM Medical Record Number: [id number] Patient Account Number: [id number] Date of Birth/Sex: Treating RN: 02/23/1940 (82 y.o. [name] Primary Care Sashay Felling: [name] Other Clinician: [name] Referring Cartina Brousseau: Treating Yaser Harvill/Extender: [name] Weeks in Treatment: 2 Wound Status Wound Number: 1 Primary Etiology: Skin Tear Wound Location: Right Lower Leg Wound Status: Open Wounding Event: Skin Tear/Laceration Comorbid History: Asthma, Hypertension, Dementia Date Acquired: 09/08/2022 Weeks Of Treatment: 2 Clustered Wound: No Photos Wound Measurements Length: (cm) 3.7 Width: (cm) 1.2 Depth: (cm) 0.4 Area: (cm) 3.487 Volume: (cm) 1.395 % Reduction in Area: 63.3% % Reduction in Volume: 79% Epithelialization: None Wound Description Classification: Full Thickness Without Exposed Support Structures Exudate Amount: Medium Exudate Type: Serosanguineous Exudate Color: red, brown Foul Odor After Cleansing: No Slough/Fibrino Yes Wound Bed Granulation Amount: Medium (34-66%) Exposed Structure Granulation Quality: Red Fat Layer (Subcutaneous  Tissue) Exposed: Yes Necrotic Amount: Medium (34-66%) Necrotic Quality: Adherent Slough Treatment Notes Wound #1 (Lower Leg) Wound Laterality: Right Cleanser [place] Ancillary Kit - 15 Day Supply Discharge Instruction: Use supplies as instructed; Kit contains: (15) Saline Bullets; (15) 3x3 Gauze; 15 pr Gloves Soap and Water Discharge Instruction: Gently cleanse wound with antibacterial soap, rinse and pat dry prior to dressing wounds Wound Cleanser Discharge Instruction: Wash your hands with soap and water. Remove old dressing, discard into plastic bag and place into trash. Cleanse the OTILA, STARN ([id number]) [id number] Page 9 of 9 wound with Wound Cleanser prior to applying a clean dressing using gauze sponges, not tissues or cotton balls. Do not scrub or use excessive force. Pat dry using gauze sponges, not tissue or cotton balls. Peri-Wound Care Topical Primary Dressing Hydrofera Blue Ready Transfer Foam, 2.5x2.5 (in/in) Discharge Instruction: placed small piece in deeper area, than larger piece over top Apply Hydrofera Blue Ready to wound bed as directed Secondary Dressing (BORDER) Zetuvit Plus SILICONE BORDER Dressing 5x5 (in/in) Discharge Instruction: Please do not put silicone bordered dressings under wraps. Use non-bordered dressing only. Secured With Tubigrip Size D, 3x10 (in/yd) Compression Wrap Compression Stockings Add-Ons Electronic Signature(s) Signed: 10/16/2022 4:28:09 PM By: Angelina Pih Signed: 10/16/2022 5:04:18 PM By: [name] Entered By: [name] on 10/16/2022 11:58:23 -------------------------------------------------------------------------------- Vitals Details Patient Name: Date of Service: Jeanne Stewart, Jeanne Searing. 10/16/2022 2:45 PM Medical Record Number: [id number] Patient Account Number: [id number] Date of Birth/Sex: Treating RN: 1940-02-25 (82 y.o. [name] Primary Care Aspin Palomarez: [name] Other  Clinician: [name] Referring Germaine Shenker: Treating Shalon Salado/Extender: [name] Weeks in Treatment: 2 Vital Signs Time Taken: 14:50 Temperature (F): 98.4 Height (in): 60 Pulse (bpm): 76 Weight (lbs): 130 Respiratory Rate (breaths/min): 18 Body Mass Index (BMI): 25.4 Blood Pressure (  mmHg): 125/81 Reference Range: 80 - 120 mg / dl Electronic Signature(s) Signed: 10/16/2022 5:04:18 PM By: [name] Entered By: [name] on 10/16/2022 11:53:03

## 2022-10-24 ENCOUNTER — Encounter: Payer: Medicare HMO | Admitting: Physician Assistant

## 2022-10-24 DIAGNOSIS — S81811A Laceration without foreign body, right lower leg, initial encounter: Secondary | ICD-10-CM | POA: Diagnosis not present

## 2022-10-24 NOTE — Progress Notes (Signed)
ANAMIKA, HERBERT ([id number]) [id number] Page 1 of 9 Visit Report for 10/24/2022 Arrival Information Details Patient Name: Date of Service: Jeanne Stewart, Jeanne Stewart. 10/24/2022 10:30 A M Medical Record Number: [id number] Patient Account Number: [id number] Date of Birth/Sex: Treating RN: 09/08/1940 (82 y.o. Freddy Finner Primary Care Margaretha Mahan: [name] Other Clinician: [name] Referring Pericles Carmicheal: Treating Edlyn Rosenburg/Extender: [name] Weeks in Treatment: 3 Visit Information History Since Last Visit All ordered tests and consults were completed: No Patient Arrived: Ambulatory Added or deleted any medications: No Arrival Time: 10:17 Any new allergies or adverse reactions: No Transfer Assistance: None Had a fall or experienced change in No Patient Identification Verified: Yes activities of daily living that may affect Secondary Verification Process Completed: Yes risk of falls: Patient Requires Transmission-Based Precautions: No Signs or symptoms of abuse/neglect since last visito No Patient Has Alerts: No Hospitalized since last visit: No Implantable device outside of the clinic excluding No cellular tissue based products placed in the center since last visit: Has Dressing in Place as Prescribed: Yes Pain Present Now: No Electronic Signature(s) Signed: 10/24/2022 1:20:52 PM By: [name] Entered By: [name] on 10/24/2022 10:24:31 -------------------------------------------------------------------------------- Clinic Level of Care Assessment Details Patient Name: Date of Service: Jeanne Stewart, Jeanne Stewart. 10/24/2022 10:30 A M Medical Record Number: [id number] Patient Account Number: [id number] Date of Birth/Sex: Treating RN: 12-19-1940 (82 y.o. Freddy Finner Primary Care Sylvan Lahm: [name] Other Clinician: [name] Referring Fields Oros: Treating Talayla Doyel/Extender: [name] Weeks in  Treatment: 3 Clinic Level of Care Assessment Items TOOL 4 Quantity Score []  - 0 Use when only an EandM is performed on FOLLOW-UP visit ASSESSMENTS - Nursing Assessment / Reassessment X- 1 10 Reassessment of Co-morbidities (includes updates in patient status) X- 1 5 Reassessment of Adherence to Treatment Plan Jeanne Stewart, Jeanne Stewart ([id number]) [phone] Page 2 of 9 ASSESSMENTS - Wound and Skin A ssessment / Reassessment X - Simple Wound Assessment / Reassessment - one wound 1 5 []  - 0 Complex Wound Assessment / Reassessment - multiple wounds []  - 0 Dermatologic / Skin Assessment (not related to wound area) ASSESSMENTS - Focused Assessment []  - 0 Circumferential Edema Measurements - multi extremities []  - 0 Nutritional Assessment / Counseling / Intervention []  - 0 Lower Extremity Assessment (monofilament, tuning fork, pulses) []  - 0 Peripheral Arterial Disease Assessment (using hand held doppler) ASSESSMENTS - Ostomy and/or Continence Assessment and Care []  - 0 Incontinence Assessment and Management []  - 0 Ostomy Care Assessment and Management (repouching, etc.) PROCESS - Coordination of Care X - Simple Patient / Family Education for ongoing care 1 15 []  - 0 Complex (extensive) Patient / Family Education for ongoing care []  - 0 Staff obtains Chiropractor, Records, T Results / Process Orders est []  - 0 Staff telephones HHA, Nursing Homes / Clarify orders / etc []  - 0 Routine Transfer to another Facility (non-emergent condition) []  - 0 Routine Hospital Admission (non-emergent condition) []  - 0 New Admissions / Manufacturing engineer / Ordering NPWT Apligraf, etc. , []  - 0 Emergency Hospital Admission (emergent condition) X- 1 10 Simple Discharge Coordination []  - 0 Complex (extensive) Discharge Coordination PROCESS - Special Needs []  - 0 Pediatric / Minor Patient Management []  - 0 Isolation Patient Management []  - 0 Hearing / Language /  Visual special needs []  - 0 Assessment of Community assistance (transportation, D/C planning, etc.) []  - 0 Additional assistance / Altered mentation []  - 0 Support Surface(s) Assessment (bed, cushion, seat, etc.) INTERVENTIONS -  Wound Cleansing / Measurement X - Simple Wound Cleansing - one wound 1 5 []  - 0 Complex Wound Cleansing - multiple wounds X- 1 5 Wound Imaging (photographs - any number of wounds) []  - 0 Wound Tracing (instead of photographs) X- 1 5 Simple Wound Measurement - one wound []  - 0 Complex Wound Measurement - multiple wounds INTERVENTIONS - Wound Dressings []  - 0 Small Wound Dressing one or multiple wounds X- 1 15 Medium Wound Dressing one or multiple wounds []  - 0 Large Wound Dressing one or multiple wounds []  - 0 Application of Medications - topical []  - 0 Application of Medications - injection INTERVENTIONS - Miscellaneous []  - 0 External ear exam Jeanne Stewart, Jeanne Stewart ([id number]) [id number] Page 3 of 9 []  - 0 Specimen Collection (cultures, biopsies, blood, body fluids, etc.) []  - 0 Specimen(s) / Culture(s) sent or taken to Lab for analysis []  - 0 Patient Transfer (multiple staff / Michiel Sites Lift / Similar devices) []  - 0 Simple Staple / Suture removal (25 or less) []  - 0 Complex Staple / Suture removal (26 or more) []  - 0 Hypo / Hyperglycemic Management (close monitor of Blood Glucose) []  - 0 Ankle / Brachial Index (ABI) - do not check if billed separately X- 1 5 Vital Signs Has the patient been seen at the hospital within the last three years: Yes Total Score: 80 Level Of Care: New/Established - Level 3 Electronic Signature(s) Signed: 10/24/2022 1:20:52 PM By: [name] Entered By: [name] on 10/24/2022 10:57:12 -------------------------------------------------------------------------------- Encounter Discharge Information Details Patient Name: Date of Service: Jeanne Stewart, Jeanne Stewart. 10/24/2022 10:30 A  M Medical Record Number: [id number] Patient Account Number: [id number] Date of Birth/Sex: Treating RN: 1940-04-08 (82 y.o. Freddy Finner Primary Care Leimomi Zervas: [name] Other Clinician: [name] Referring Dyllan Kats: Treating Yaviel Kloster/Extender: [name] Weeks in Treatment: 3 Encounter Discharge Information Items Discharge Condition: Stable Ambulatory Status: Ambulatory Discharge Destination: Home Transportation: Private Auto Accompanied By: self Schedule Follow-up Appointment: Yes Clinical Summary of Care: Electronic Signature(s) Signed: 10/24/2022 1:20:52 PM By: [name] Entered By: [name] on 10/24/2022 11:04:02 Lower Extremity Assessment Details -------------------------------------------------------------------------------- Jeanne Stewart ([id number]) [id number] Page 4 of 9 Patient Name: Date of Service: KARENE, SCHNITTKER. 10/24/2022 10:30 A M Medical Record Number: [id number] Patient Account Number: [id number] Date of Birth/Sex: Treating RN: 1940/09/29 (82 y.o. Freddy Finner Primary Care Visente Kirker: [name] Other Clinician: [name] Referring Ladanian Kelter: Treating Deron Poole/Extender: [name] Weeks in Treatment: 3 Edema Assessment Left: Right: Assessed: No Yes Edema: Yes Calf Left: Right: Point of Measurement: 30 cm From Medial Instep 33 cm Ankle Left: Right: Point of Measurement: 11 cm From Medial Instep 19.5 cm Vascular Assessment Left: Right: Pulses: Dorsalis Pedis Palpable: Yes Extremity colors, hair growth, and conditions: Extremity Color: Normal Hair Growth on Extremity: Yes Temperature of Extremity: Cool Capillary Refill: < 3 seconds Toe Nail Assessment Left: Right: Thick: No Discolored: No Deformed: No Improper Length and Hygiene: No Electronic Signature(s) Signed: 10/24/2022 11:41:18 AM By: Yevonne Pax RN Signed: 10/24/2022 1:20:52 PM By:  [name] Entered By: [name] on 10/24/2022 10:32:18 -------------------------------------------------------------------------------- Multi Wound Chart Details Patient Name: Date of Service: Jeanne Stewart, Jeanne Stewart. 10/24/2022 10:30 A M Medical Record Number: [id number] Patient Account Number: [id number] Date of Birth/Sex: Treating RN: [birth year] (82 y.o. Freddy Finner Primary Care Charmaine Placido: [name] Other Clinician: [name] Referring Lindley Stachnik: Treating Marlissa Emerick/Extender: [name] Weeks in Treatment: 3 Vital Signs Height(in): 60 Pulse(bpm): 71  Weight(lbs): 130 Blood Pressure(mmHg): 119/77 Body Mass Index(BMI): 25.4 Temperature(F): 97.9 Respiratory Rate(breaths/min): [address] ([id number]) [id number] Page 5 of 9 [1:Photos:] [N/A:N/A] Right Lower Leg N/A N/A Wound Location: Skin Tear/Laceration N/A N/A Wounding Event: Skin Tear N/A N/A Primary Etiology: Asthma, Hypertension, Dementia N/A N/A Comorbid History: 09/08/2022 N/A N/A Date Acquired: 3 N/A N/A Weeks of Treatment: Open N/A N/A Wound Status: No N/A N/A Wound Recurrence: 1.8x0.5x0.2 N/A N/A Measurements L x W x D (cm) 0.707 N/A N/A A (cm) : rea 0.141 N/A N/A Volume (cm) : 92.60% N/A N/A % Reduction in A rea: 97.90% N/A N/A % Reduction in Volume: Full Thickness Without Exposed N/A N/A Classification: Support Structures Medium N/A N/A Exudate A mount: Serosanguineous N/A N/A Exudate Type: red, brown N/A N/A Exudate Color: Medium (34-66%) N/A N/A Granulation A mount: Red N/A N/A Granulation Quality: Medium (34-66%) N/A N/A Necrotic A mount: Fat Layer (Subcutaneous Tissue): Yes N/A N/A Exposed Structures: None N/A N/A Epithelialization: Treatment Notes Electronic Signature(s) Signed: 10/24/2022 1:20:52 PM By: [name] Entered By: [name] on 10/24/2022  10:32:24 -------------------------------------------------------------------------------- Multi-Disciplinary Care Plan Details Patient Name: Date of Service: Jeanne Stewart, Jeanne Stewart. 10/24/2022 10:30 A M Medical Record Number: [id number] Patient Account Number: [id number] Date of Birth/Sex: Treating RN: June 29, 1940 (82 y.o. Freddy Finner Primary Care Kamaron Deskins: [name] Other Clinician: [name] Referring Sadira Standard: Treating Tyah Acord/Extender: [name] Weeks in Treatment: 3 Active Inactive Necrotic Tissue Nursing Diagnoses: Impaired tissue integrity related to necrotic/devitalized tissue Knowledge deficit related to management of necrotic/devitalized tissue Goals: Necrotic/devitalized tissue will be minimized in the wound bed EVEE, ABUNDIZ ([id number]) [phone].pdf Page 6 of 9 Date Initiated: 10/02/2022 Target Resolution Date: 11/27/2022 Goal Status: Active Patient/caregiver will verbalize understanding of reason and process for debridement of necrotic tissue Date Initiated: 10/02/2022 Date Inactivated: 10/02/2022 Target Resolution Date: 10/02/2022 Goal Status: Met Interventions: Assess patient pain level pre-, during and post procedure and prior to discharge Provide education on necrotic tissue and debridement process Treatment Activities: Apply topical anesthetic as ordered : 10/02/2022 Excisional debridement : 10/02/2022 Notes: Wound/Skin Impairment Nursing Diagnoses: Impaired tissue integrity Knowledge deficit related to ulceration/compromised skin integrity Goals: Ulcer/skin breakdown will have a volume reduction of 30% by week 4 Date Initiated: 10/02/2022 Target Resolution Date: 10/30/2022 Goal Status: Active Ulcer/skin breakdown will have a volume reduction of 50% by week 8 Date Initiated: 10/02/2022 Target Resolution Date: 11/27/2022 Goal Status: Active Ulcer/skin breakdown will have a volume reduction of 80% by week  12 Date Initiated: 10/02/2022 Target Resolution Date: 12/25/2022 Goal Status: Active Ulcer/skin breakdown will heal within 14 weeks Date Initiated: 10/02/2022 Target Resolution Date: 01/08/2023 Goal Status: Active Interventions: Assess patient/caregiver ability to obtain necessary supplies Assess patient/caregiver ability to perform ulcer/skin care regimen upon admission and as needed Assess ulceration(s) every visit Provide education on ulcer and skin care Treatment Activities: Skin care regimen initiated : 10/02/2022 Notes: Electronic Signature(s) Signed: 10/24/2022 11:41:18 AM By: Yevonne Pax RN Signed: 10/24/2022 1:20:52 PM By: [name] Entered By: [name] on 10/24/2022 10:57:26 -------------------------------------------------------------------------------- Pain Assessment Details Patient Name: Date of Service: Jeanne Stewart, Jeanne Stewart. 10/24/2022 10:30 A M Medical Record Number: [id number] Patient Account Number: [id number] Date of Birth/Sex: Treating RN: 11-19-1940 (82 y.o. Freddy Finner Primary Care Jonan Seufert: [name] Other Clinician: [name] Referring Evanthia Maund: Treating Shardea Cwynar/Extender: [name] Weeks in Treatment: [address] Jeanne Stewart, Jeanne Stewart ([id number]) [id number] Page 7 of 9 Location of Pain Severity and Description of Pain Patient Has Paino  No Site Locations Pain Management and Medication Current Pain Management: Electronic Signature(s) Signed: 10/24/2022 11:41:18 AM By: Yevonne Pax RN Signed: 10/24/2022 1:20:52 PM By: [name] Entered By: [name] on 10/24/2022 10:26:36 -------------------------------------------------------------------------------- Patient/Caregiver Education Details Patient Name: Date of Service: Jeanne Stewart 10/18/2024andnbsp10:30 A M Medical Record Number: [id number] Patient Account Number: [id number] Date of Birth/Gender: Treating  RN: [birth year] (82 y.o. Freddy Finner Primary Care Physician: [name] Other Clinician: [name] Referring Physician: Treating Physician/Extender: [name] Weeks in Treatment: 3 Education Assessment Education Provided To: Patient Education Topics Provided Wound/Skin Impairment: Handouts: Other: continue wound care as directed Methods: Explain/Verbal Responses: State content correctly Electronic Signature(s) Signed: 10/24/2022 1:20:52 PM By: [name] Entered By: [name] on 10/24/2022 10:57:42 Jeanne Stewart ([id number]) [id number] Page 8 of 9 -------------------------------------------------------------------------------- Wound Assessment Details Patient Name: Date of Service: Jeanne Stewart, Jeanne Stewart. 10/24/2022 10:30 A M Medical Record Number: [id number] Patient Account Number: [id number] Date of Birth/Sex: Treating RN: [birth year] (82 y.o. Freddy Finner Primary Care Fintan Grater: [name] Other Clinician: [name] Referring Jahlon Baines: Treating Taresa Montville/Extender: [name] Weeks in Treatment: 3 Wound Status Wound Number: 1 Primary Etiology: Skin Tear Wound Location: Right Lower Leg Wound Status: Open Wounding Event: Skin Tear/Laceration Comorbid History: Asthma, Hypertension, Dementia Date Acquired: 09/08/2022 Weeks Of Treatment: 3 Clustered Wound: No Photos Wound Measurements Length: (cm) 1.8 Width: (cm) 0.5 Depth: (cm) 0.2 Area: (cm) 0.707 Volume: (cm) 0.141 % Reduction in Area: 92.6% % Reduction in Volume: 97.9% Epithelialization: None Wound Description Classification: Full Thickness Without Exposed Support Structures Exudate Amount: Medium Exudate Type: Serosanguineous Exudate Color: red, brown Foul Odor After Cleansing: No Slough/Fibrino Yes Wound Bed Granulation Amount: Medium (34-66%) Exposed Structure Granulation Quality: Red Fat Layer (Subcutaneous  Tissue) Exposed: Yes Necrotic Amount: Medium (34-66%) Necrotic Quality: Adherent Slough Treatment Notes Wound #1 (Lower Leg) Wound Laterality: Right Cleanser [place] Ancillary Kit - 15 Day Supply Discharge Instruction: Use supplies as instructed; Kit contains: (15) Saline Bullets; (15) 3x3 Gauze; 15 pr Gloves Soap and Water Discharge Instruction: Gently cleanse wound with antibacterial soap, rinse and pat dry prior to dressing wounds Wound Cleanser Discharge Instruction: Wash your hands with soap and water. Remove old dressing, discard into plastic bag and place into trash. Cleanse the Jeanne Stewart, Jeanne Stewart ([id number]) [id number] Page 9 of 9 wound with Wound Cleanser prior to applying a clean dressing using gauze sponges, not tissues or cotton balls. Do not scrub or use excessive force. Pat dry using gauze sponges, not tissue or cotton balls. Peri-Wound Care Topical Primary Dressing Hydrofera Blue Ready Transfer Foam, 2.5x2.5 (in/in) Discharge Instruction: placed small piece in deeper area, than larger piece over top Apply Hydrofera Blue Ready to wound bed as directed Secondary Dressing (BORDER) Zetuvit Plus SILICONE BORDER Dressing 5x5 (in/in) Discharge Instruction: Please do not put silicone bordered dressings under wraps. Use non-bordered dressing only. Secured With Tubigrip Size D, 3x10 (in/yd) Compression Wrap Compression Stockings Add-Ons Electronic Signature(s) Signed: 10/24/2022 11:41:18 AM By: Yevonne Pax RN Signed: 10/24/2022 1:20:52 PM By: [name] Entered By: [name] on 10/24/2022 10:30:21 -------------------------------------------------------------------------------- Vitals Details Patient Name: Date of Service: Jeanne Stewart, Jeanne Stewart. 10/24/2022 10:30 A M Medical Record Number: [id number] Patient Account Number: [id number] Date of Birth/Sex: Treating RN: Sep 07, 1940 (82 y.o. Freddy Finner Primary Care Dewon Mendizabal: [name] Other  Clinician: [name] Referring Clancey Welton: Treating Loletha Bertini/Extender: [name] Weeks in Treatment: 3 Vital Signs Time Taken: 10:25 Temperature (F): 97.9 Height (in): 60 Pulse (  bpm): 71 Weight (lbs): 130 Respiratory Rate (breaths/min): 18 Body Mass Index (BMI): 25.4 Blood Pressure (mmHg): 119/77 Reference Range: 80 - 120 mg / dl Electronic Signature(s) Signed: 10/24/2022 1:20:52 PM By: [name] Entered By: [name] on 10/24/2022 10:26:29

## 2022-10-24 NOTE — Progress Notes (Addendum)
Jeanne Stewart, Jeanne Stewart ([id number]) [id number] Page 1 of 6 Visit Report for 10/24/2022 Chief Complaint Document Details Patient Name: Date of Service: Jeanne Stewart, Jeanne Stewart. 10/24/2022 10:30 A M Medical Record Number: [id number] Patient Account Number: [id number] Date of Birth/Sex: Treating RN: 1940/04/20 (82 y.o. Freddy Finner Primary Care Provider: [name] Other Clinician: [name] Referring Provider: Treating Provider/Extender: [name] Weeks in Treatment: 3 Information Obtained from: Patient Chief Complaint Skin tear right leg Electronic Signature(s) Signed: 10/24/2022 10:28:00 AM By: Allen Derry PA-C Entered By: Allen Derry on 10/24/2022 10:28:00 -------------------------------------------------------------------------------- HPI Details Patient Name: Date of Service: Jeanne Stewart, Jeanne Stewart. 10/24/2022 10:30 A M Medical Record Number: [id number] Patient Account Number: [id number] Date of Birth/Sex: Treating RN: [birth year] (82 y.o. Freddy Finner Primary Care Provider: [name] Other Clinician: [name] Referring Provider: Treating Provider/Extender: [name] Weeks in Treatment: 3 History of Present Illness Chronic/Inactive Conditions Condition 1: 10-02-2022 patient's ABI on the office today was 1.31 on the right on screening. HPI Description: 10-02-2022 upon evaluation today patient presents for initial inspection here in our clinic concerning issues that she has been having with a wound over the right anterior lower extremity. This occurred on September 08, 2022. She states this happened on Labor Day she did not go to the ER. This is a laceration which unfortunately has opened further at this point. There does appear to be some need for sharp debridement to clearway necrotic debris. We are definitely can work on this and try to see what we can do about getting this moving in the right direction  much more effectively and quickly. Patient does have a history of hypertension along with vascular dementia. Unfortunately she takes most dressings off that are put in place and I think that is going to be the biggest issue here. 10-09-2022 upon evaluation today patient appears to be doing well currently in regard to her wound which is actually measuring better and looking better as well today. Fortunately I do not see any signs of active infection which is good news. Unfortunately the biggest issue here is we have been having trouble getting her to keep her dressing in place and intact. With the dementia she is consistently taking this off unfortunately. 10-16-2022 upon evaluation today patient appears to be doing well currently in regard to her wound. She has been tolerating the dressing changes the Hydrofera Blue fortunately there does not appear to be any signs of infection and she seems to be doing quite well. JYLA, Jeanne Stewart ([id number]) [id number] Page 2 of 6 10-24-2022 upon evaluation today patient appears to be doing well currently in regard to her wound. She has been tolerating the dressing changes without complication. Fortunately I do not see any signs of worsening her wound actually showing signs of excellent improvement. Electronic Signature(s) Signed: 10/24/2022 11:04:43 AM By: Allen Derry PA-C Entered By: Allen Derry on 10/24/2022 11:04:43 -------------------------------------------------------------------------------- Physical Exam Details Patient Name: Date of Service: Jeanne Stewart, Jeanne Stewart. 10/24/2022 10:30 A M Medical Record Number: [id number] Patient Account Number: [id number] Date of Birth/Sex: Treating RN: 19-Sep-1940 (82 y.o. Freddy Finner Primary Care Provider: [name] Other Clinician: [name] Referring Provider: Treating Provider/Extender: [name] Weeks in Treatment: 3 Constitutional Well-nourished and  well-hydrated in no acute distress. Respiratory normal breathing without difficulty. Psychiatric this patient is able to make decisions and demonstrates good insight into disease process. Alert and Oriented x 3. pleasant and cooperative. Notes Upon inspection patient's wound bed  actually showed signs of good granulation epithelization at this point. Fortunately I do not see any evidence of worsening overall and I do believe that the patient is making excellent headway towards complete closure. Electronic Signature(s) Signed: 10/24/2022 11:04:56 AM By: Allen Derry PA-C Entered By: Allen Derry on 10/24/2022 11:04:55 -------------------------------------------------------------------------------- Physician Orders Details Patient Name: Date of Service: Jeanne Stewart, Jeanne Stewart. 10/24/2022 10:30 A M Medical Record Number: [id number] Patient Account Number: [id number] Date of Birth/Sex: Treating RN: 05-16-1940 (82 y.o. Freddy Finner Primary Care Provider: [name] Other Clinician: [name] Referring Provider: Treating Provider/Extender: [name] Weeks in Treatment: 3 Verbal / Phone Orders: Yes Clinician: Yevonne Pax Read Back and Verified: Yes Diagnosis Coding ICD-10 Coding Jeanne Stewart, Jeanne Stewart ([id number]) [id number] Page 3 of 6 Code Description [phone] Laceration without foreign body, right lower leg, initial encounter L97.812 Non-pressure chronic ulcer of other part of right lower leg with fat layer exposed I10 Essential (primary) hypertension F01.A18 Vascular dementia, mild, with other behavioral disturbance Follow-up Appointments Return Appointment in 1 week. Bathing/ Shower/ Hygiene May shower with wound dressing protected with water repellent cover or cast protector. No tub bath. Anesthetic (Use 'Patient Medications' Section for Anesthetic Order Entry) Lidocaine applied to wound bed Edema Control - Lymphedema / Segmental  Compressive Device / Other Elevate, Exercise Daily and A void Standing for Long Periods of Time. Elevate leg(s) parallel to the floor when sitting. DO YOUR BEST to sleep in the bed at night. DO NOT sleep in your recliner. Long hours of sitting in a recliner leads to swelling of the legs and/or potential wounds on your backside. Wound Treatment Wound #1 - Lower Leg Wound Laterality: Right Cleanser: Byram Ancillary Kit - 15 Day Supply (Generic) 3 x Per Week/15 Days Discharge Instructions: Use supplies as instructed; Kit contains: (15) Saline Bullets; (15) 3x3 Gauze; 15 pr Gloves Cleanser: Soap and Water 3 x Per Week/15 Days Discharge Instructions: Gently cleanse wound with antibacterial soap, rinse and pat dry prior to dressing wounds Cleanser: Wound Cleanser 3 x Per Week/15 Days Discharge Instructions: Wash your hands with soap and water. Remove old dressing, discard into plastic bag and place into trash. Cleanse the wound with Wound Cleanser prior to applying a clean dressing using gauze sponges, not tissues or cotton balls. Do not scrub or use excessive force. Pat dry using gauze sponges, not tissue or cotton balls. Prim Dressing: Hydrofera Blue Ready Transfer Foam, 2.5x2.5 (in/in) (Generic) 3 x Per Week/15 Days ary Discharge Instructions: placed small piece in deeper area, than larger piece over top Apply Hydrofera Blue Ready to wound bed as directed Secondary Dressing: (BORDER) Zetuvit Plus SILICONE BORDER Dressing 5x5 (in/in) (Generic) 3 x Per Week/15 Days Discharge Instructions: Please do not put silicone bordered dressings under wraps. Use non-bordered dressing only. Secured With: Tubigrip Size D, 3x10 (in/yd) 3 x Per Week/15 Days Electronic Signature(s) Signed: 10/24/2022 12:47:37 PM By: Allen Derry PA-C Signed: 10/24/2022 1:20:52 PM By: [name] Entered By: [name] on 10/24/2022  10:56:46 -------------------------------------------------------------------------------- Problem List Details Patient Name: Date of Service: Jeanne Stewart, Jeanne Stewart. 10/24/2022 10:30 A M Medical Record Number: [id number] Patient Account Number: [id number] Date of Birth/Sex: Treating RN: 11/11/1940 (82 y.o. Freddy Finner Primary Care Provider: [name] Other Clinician: [name] Referring Provider: Treating Provider/Extender: [name] Weeks in Treatment: 3 Active Problems ICD-10 Jeanne Stewart, Jeanne Stewart ([id number]) [id number] Page 4 of 6 Encounter Code Description Active Date MDM Diagnosis S81.811A Laceration without foreign body, right lower leg,  initial encounter 10/02/2022 No Yes L97.812 Non-pressure chronic ulcer of other part of right lower leg with fat layer 10/02/2022 No Yes exposed I10 Essential (primary) hypertension 10/02/2022 No Yes F01.A18 Vascular dementia, mild, with other behavioral disturbance 10/02/2022 No Yes Inactive Problems Resolved Problems Electronic Signature(s) Signed: 10/24/2022 10:27:56 AM By: Allen Derry PA-C Entered By: Allen Derry on 10/24/2022 10:27:55 -------------------------------------------------------------------------------- Progress Note Details Patient Name: Date of Service: Jeanne Stewart, Jeanne Stewart. 10/24/2022 10:30 A M Medical Record Number: [id number] Patient Account Number: [id number] Date of Birth/Sex: Treating RN: 10/08/1940 (82 y.o. Freddy Finner Primary Care Provider: [name] Other Clinician: [name] Referring Provider: Treating Provider/Extender: [name] Weeks in Treatment: 3 Subjective Chief Complaint Information obtained from Patient Skin tear right leg History of Present Illness (HPI) Chronic/Inactive Condition: 10-02-2022 patient's ABI on the office today was 1.31 on the right on screening. 10-02-2022 upon evaluation today patient presents for  initial inspection here in our clinic concerning issues that she has been having with a wound over the right anterior lower extremity. This occurred on September 08, 2022. She states this happened on Labor Day she did not go to the ER. This is a laceration which unfortunately has opened further at this point. There does appear to be some need for sharp debridement to clearway necrotic debris. We are definitely can work on this and try to see what we can do about getting this moving in the right direction much more effectively and quickly. Patient does have a history of hypertension along with vascular dementia. Unfortunately she takes most dressings off that are put in place and I think that is going to be the biggest issue here. 10-09-2022 upon evaluation today patient appears to be doing well currently in regard to her wound which is actually measuring better and looking better as well today. Fortunately I do not see any signs of active infection which is good news. Unfortunately the biggest issue here is we have been having trouble getting her to keep her dressing in place and intact. With the dementia she is consistently taking this off unfortunately. 10-16-2022 upon evaluation today patient appears to be doing well currently in regard to her wound. She has been tolerating the dressing changes the Hydrofera Blue fortunately there does not appear to be any signs of infection and she seems to be doing quite well. 10-24-2022 upon evaluation today patient appears to be doing well currently in regard to her wound. She has been tolerating the dressing changes without complication. Fortunately I do not see any signs of worsening her wound actually showing signs of excellent improvement. Jeanne Stewart, Jeanne Stewart ([id number]) [id number] Page 5 of 6 Objective Constitutional Well-nourished and well-hydrated in no acute distress. Vitals Time Taken: 10:25 AM, Height: 60 in, Weight: 130  lbs, BMI: 25.4, Temperature: 97.9 F, Pulse: 71 bpm, Respiratory Rate: 18 breaths/min, Blood Pressure: 119/77 mmHg. Respiratory normal breathing without difficulty. Psychiatric this patient is able to make decisions and demonstrates good insight into disease process. Alert and Oriented x 3. pleasant and cooperative. General Notes: Upon inspection patient's wound bed actually showed signs of good granulation epithelization at this point. Fortunately I do not see any evidence of worsening overall and I do believe that the patient is making excellent headway towards complete closure. Integumentary (Hair, Skin) Wound #1 status is Open. Original cause of wound was Skin Tear/Laceration. The date acquired was: 09/08/2022. The wound has been in treatment 3 weeks. The wound is located on the Right Lower Leg.  The wound measures 1.8cm length x 0.5cm width x 0.2cm depth; 0.707cm^2 area and 0.141cm^3 volume. There is Fat Layer (Subcutaneous Tissue) exposed. There is a medium amount of serosanguineous drainage noted. There is medium (34-66%) red granulation within the wound bed. There is a medium (34-66%) amount of necrotic tissue within the wound bed including Adherent Slough. Assessment Active Problems ICD-10 Laceration without foreign body, right lower leg, initial encounter Non-pressure chronic ulcer of other part of right lower leg with fat layer exposed Essential (primary) hypertension Vascular dementia, mild, with other behavioral disturbance Plan Follow-up Appointments: Return Appointment in 1 week. Bathing/ Shower/ Hygiene: May shower with wound dressing protected with water repellent cover or cast protector. No tub bath. Anesthetic (Use 'Patient Medications' Section for Anesthetic Order Entry): Lidocaine applied to wound bed Edema Control - Lymphedema / Segmental Compressive Device / Other: Elevate, Exercise Daily and Avoid Standing for Long Periods of Time. Elevate leg(s) parallel to the  floor when sitting. DO YOUR BEST to sleep in the bed at night. DO NOT sleep in your recliner. Long hours of sitting in a recliner leads to swelling of the legs and/or potential wounds on your backside. WOUND #1: - Lower Leg Wound Laterality: Right Cleanser: Byram Ancillary Kit - 15 Day Supply (Generic) 3 x Per Week/15 Days Discharge Instructions: Use supplies as instructed; Kit contains: (15) Saline Bullets; (15) 3x3 Gauze; 15 pr Gloves Cleanser: Soap and Water 3 x Per Week/15 Days Discharge Instructions: Gently cleanse wound with antibacterial soap, rinse and pat dry prior to dressing wounds Cleanser: Wound Cleanser 3 x Per Week/15 Days Discharge Instructions: Wash your hands with soap and water. Remove old dressing, discard into plastic bag and place into trash. Cleanse the wound with Wound Cleanser prior to applying a clean dressing using gauze sponges, not tissues or cotton balls. Do not scrub or use excessive force. Pat dry using gauze sponges, not tissue or cotton balls. Prim Dressing: Hydrofera Blue Ready Transfer Foam, 2.5x2.5 (in/in) (Generic) 3 x Per Week/15 Days ary Discharge Instructions: placed small piece in deeper area, than larger piece over top Apply Hydrofera Blue Ready to wound bed as directed Secondary Dressing: (BORDER) Zetuvit Plus SILICONE BORDER Dressing 5x5 (in/in) (Generic) 3 x Per Week/15 Days Discharge Instructions: Please do not put silicone bordered dressings under wraps. Use non-bordered dressing only. Secured With: Tubigrip Size D, 3x10 (in/yd) 3 x Per Week/15 Days 1. I would recommend that we continue with the [facility] which I think is doing a really good job. 2. Also can recommend the patient should continue with her bordered foam dressing to cover. With 3 we will continue the Tubigrip size D. We will see patient back for reevaluation in 1 week here in the clinic. If anything worsens or changes patient will contact our office for additional CAYMAN, FORT ([id number]) [id number] Page 6 of 6 recommendations. Electronic Signature(s) Signed: 10/24/2022 11:05:13 AM By: Allen Derry PA-C Entered By: Allen Derry on 10/24/2022 11:05:13 -------------------------------------------------------------------------------- SuperBill Details Patient Name: Date of Service: Jeanne Stewart, Jeanne Stewart. 10/24/2022 Medical Record Number: [id number] Patient Account Number: [id number] Date of Birth/Sex: Treating RN: Nov 11, 1940 (81 y.o. Freddy Finner Primary Care Provider: [name] Other Clinician: [name] Referring Provider: Treating Provider/Extender: [name] Weeks in Treatment: 3 Diagnosis Coding ICD-10 Codes Code Description [phone] Laceration without foreign body, right lower leg, initial encounter L97.812 Non-pressure chronic ulcer of other part of right lower leg with fat layer exposed I10 Essential (primary) hypertension F01.A18 Vascular dementia,  mild, with other behavioral disturbance Facility Procedures : CPT4 Code: [id number] Description: 99213 - WOUND CARE VISIT-LEV 3 EST PT Modifier: Quantity: 1 Physician Procedures : CPT4 Code Description Modifier [id number] 99213 - WC PHYS LEVEL 3 - EST PT ICD-10 Diagnosis Description S81.811A Laceration without foreign body, right lower leg, initial encounter L97.812 Non-pressure chronic ulcer of other part of right lower leg with  fat layer exposed I10 Essential (primary) hypertension F01.A18 Vascular dementia, mild, with other behavioral disturbance Quantity: 1 Electronic Signature(s) Signed: 10/24/2022 11:11:46 AM By: Allen Derry PA-C Entered By: Allen Derry on 10/24/2022 11:11:46

## 2022-10-31 ENCOUNTER — Encounter: Payer: Medicare HMO | Admitting: Physician Assistant

## 2022-10-31 DIAGNOSIS — S81811A Laceration without foreign body, right lower leg, initial encounter: Secondary | ICD-10-CM | POA: Diagnosis not present

## 2022-10-31 NOTE — Progress Notes (Addendum)
Jeanne Stewart ([id number]) [id number] Page 1 of 9 Visit Report for 10/31/2022 Arrival Information Details Patient Name: Date of Service: Jeanne Stewart, Jeanne Stewart. 10/31/2022 10:30 A M Medical Record Number: [id number] Patient Account Number: [id number] Date of Birth/Sex: Treating RN: 1940/05/14 (82 y.o. Jeanne Stewart Primary Care Jeanne Stewart: Jeanne Stewart Other Clinician: Betha Stewart Referring Jeanne Stewart: Treating Jeanne Stewart/Extender: Jeanne Stewart Weeks in Treatment: 4 Visit Information History Since Last Visit All ordered tests and consults were completed: No Patient Arrived: Ambulatory Added or deleted any medications: No Arrival Time: 10:36 Any new allergies or adverse reactions: No Transfer Assistance: None Had a fall or experienced change in No Patient Identification Verified: Yes activities of Stewart living that may affect Secondary Verification Process Completed: Yes risk of falls: Patient Requires Transmission-Based Precautions: No Signs or symptoms of abuse/neglect since last visito No Patient Has Alerts: No Hospitalized since last visit: No Implantable device outside of the clinic excluding No cellular tissue based products placed in the center since last visit: Has Dressing in Place as Prescribed: Yes Has Compression in Place as Prescribed: Yes Pain Present Now: No Electronic Signature(s) Signed: 10/31/2022 12:12:18 PM By: Jeanne Stewart Entered By: Jeanne Stewart on 10/31/2022 07:36:50 -------------------------------------------------------------------------------- Clinic Level of Care Assessment Details Patient Name: Date of Service: Jeanne Stewart. 10/31/2022 10:30 A M Medical Record Number: [id number] Patient Account Number: [id number] Date of Birth/Sex: Treating RN: 1940/11/28 (82 y.o. Jeanne Stewart Primary Care Jeanne Stewart: Jeanne Stewart Other Clinician: Betha Stewart Referring Jeanne Stewart: Treating  Jeanne Stewart: Jeanne Stewart Weeks in Treatment: 4 Clinic Level of Care Assessment Items TOOL 4 Quantity Score []  - 0 Use when only an EandM is performed on FOLLOW-UP visit ASSESSMENTS - Nursing Assessment / Reassessment X- 1 10 Reassessment of Co-morbidities (includes updates in patient status) Jeanne Stewart, Jeanne Stewart ([id number]) [phone].pdf Page 2 of 9 X- 1 5 Reassessment of Adherence to Treatment Plan ASSESSMENTS - Wound and Skin A ssessment / Reassessment X - Simple Wound Assessment / Reassessment - one wound 1 5 []  - 0 Complex Wound Assessment / Reassessment - multiple wounds []  - 0 Dermatologic / Skin Assessment (not related to wound area) ASSESSMENTS - Focused Assessment []  - 0 Circumferential Edema Measurements - multi extremities []  - 0 Nutritional Assessment / Counseling / Intervention []  - 0 Lower Extremity Assessment (monofilament, tuning fork, pulses) []  - 0 Peripheral Arterial Disease Assessment (using hand held doppler) ASSESSMENTS - Ostomy and/or Continence Assessment and Care []  - 0 Incontinence Assessment and Management []  - 0 Ostomy Care Assessment and Management (repouching, etc.) PROCESS - Coordination of Care X - Simple Patient / Family Education for ongoing care 1 15 []  - 0 Complex (extensive) Patient / Family Education for ongoing care []  - 0 Staff obtains Chiropractor, Records, T Results / Process Orders est []  - 0 Staff telephones HHA, Nursing Homes / Clarify orders / etc []  - 0 Routine Transfer to another Facility (non-emergent condition) []  - 0 Routine Hospital Admission (non-emergent condition) []  - 0 New Admissions / Manufacturing engineer / Ordering NPWT Apligraf, etc. , []  - 0 Emergency Hospital Admission (emergent condition) X- 1 10 Simple Discharge Coordination []  - 0 Complex (extensive) Discharge Coordination PROCESS - Special Needs []  - 0 Pediatric / Minor Patient Management []  -  0 Isolation Patient Management []  - 0 Hearing / Language / Visual special needs []  - 0 Assessment of Community assistance (transportation, D/C planning, etc.) []  - 0 Additional assistance / Altered mentation []  - 0 Support Surface(s)  Assessment (bed, cushion, seat, etc.) INTERVENTIONS - Wound Cleansing / Measurement X - Simple Wound Cleansing - one wound 1 5 []  - 0 Complex Wound Cleansing - multiple wounds X- 1 5 Wound Imaging (photographs - any number of wounds) []  - 0 Wound Tracing (instead of photographs) X- 1 5 Simple Wound Measurement - one wound []  - 0 Complex Wound Measurement - multiple wounds INTERVENTIONS - Wound Dressings []  - 0 Small Wound Dressing one or multiple wounds X- 1 15 Medium Wound Dressing one or multiple wounds []  - 0 Large Wound Dressing one or multiple wounds []  - 0 Application of Medications - topical []  - 0 Application of Medications - injection INTERVENTIONS - Miscellaneous Jeanne Stewart, Jeanne Stewart ([id number]) [id number] Page 3 of 9 []  - 0 External ear exam []  - 0 Specimen Collection (cultures, biopsies, blood, body fluids, etc.) []  - 0 Specimen(s) / Culture(s) sent or taken to Lab for analysis []  - 0 Patient Transfer (multiple staff / Michiel Sites Lift / Similar devices) []  - 0 Simple Staple / Suture removal (25 or less) []  - 0 Complex Staple / Suture removal (26 or more) []  - 0 Hypo / Hyperglycemic Management (close monitor of Blood Glucose) []  - 0 Ankle / Brachial Index (ABI) - do not check if billed separately X- 1 5 Vital Signs Has the patient been seen at the hospital within the last three years: Yes Total Score: 80 Level Of Care: New/Established - Level 3 Electronic Signature(s) Signed: 10/31/2022 12:12:18 PM By: Jeanne Stewart Entered By: Jeanne Stewart on 10/31/2022 08:02:17 -------------------------------------------------------------------------------- Encounter Discharge Information Details Patient  Name: Date of Service: Jeanne Stewart, Jeanne Stewart. 10/31/2022 10:30 A M Medical Record Number: [id number] Patient Account Number: [id number] Date of Birth/Sex: Treating RN: Jan 06, 1941 (82 y.o. Jeanne Stewart Primary Care Jeanne Stewart: Jeanne Stewart Other Clinician: Betha Stewart Referring Mehkai Gallo: Treating Tanai Bouler/Extender: Jeanne Stewart Weeks in Treatment: 4 Encounter Discharge Information Items Discharge Condition: Stable Ambulatory Status: Ambulatory Discharge Destination: Home Transportation: Private Auto Accompanied By: son Schedule Follow-up Appointment: Yes Clinical Summary of Care: Electronic Signature(s) Signed: 10/31/2022 12:12:18 PM By: Jeanne Stewart Entered By: Jeanne Stewart on 10/31/2022 08:14:43 Laveda Abbe ([id number]) [id number] Page 4 of 9 -------------------------------------------------------------------------------- Lower Extremity Assessment Details Patient Name: Date of Service: Jeanne Stewart, Jeanne Stewart. 10/31/2022 10:30 A M Medical Record Number: [id number] Patient Account Number: [id number] Date of Birth/Sex: Treating RN: Jan 21, 1940 (82 y.o. Jeanne Stewart Primary Care Dexter Signor: Jeanne Stewart Other Clinician: Betha Stewart Referring Bon Dowis: Treating Yvan Dority/Extender: Jeanne Stewart Weeks in Treatment: 4 Edema Assessment Assessed: [Left: No] [Right: Yes] Edema: [Left: Ye] [Right: s] Calf Left: Right: Point of Measurement: 30 cm From Medial Instep 33 cm Ankle Left: Right: Point of Measurement: 11 cm From Medial Instep 20.5 cm Vascular Assessment Pulses: Dorsalis Pedis Palpable: [Right:Yes] Extremity colors, hair growth, and conditions: Extremity Color: [Right:Normal] Hair Growth on Extremity: [Right:Yes] Temperature of Extremity: [Right:Warm < 3 seconds] Toe Nail Assessment Left: Right: Thick: No Discolored: No Deformed: No Improper Length and Hygiene: No Electronic  Signature(s) Signed: 10/31/2022 12:12:18 PM By: Jeanne Stewart Signed: 10/31/2022 1:21:28 PM By: Angelina Pih Entered By: Jeanne Stewart on 10/31/2022 07:45:02 -------------------------------------------------------------------------------- Multi Wound Chart Details Patient Name: Date of Service: Jeanne Stewart, Jeanne Stewart. 10/31/2022 10:30 A M Medical Record Number: [id number] Patient Account Number: [id number] Date of Birth/Sex: Treating RN: [birth year] (82 y.o. Jeanne Stewart Primary Care Neizan Debruhl: Jeanne Stewart Other Clinician: Betha Stewart Referring Verl Kitson: Treating Yuvin Bussiere/Extender: Jeanne Stewart Weeks in Treatment: 4 Vital  Signs Height(in): 60 Pulse(bpm): 65 Weight(lbs): 130 Blood Pressure(mmHg): 131/76 Body Mass Index(BMI): 25.4 Temperature(F): 97.6 Jeanne Stewart, Jeanne Stewart ([id number]) [id number] Page 5 of 9 Respiratory Rate(breaths/min): 18 [1:Photos:] [N/A:N/A] Right Lower Leg N/A N/A Wound Location: Skin Tear/Laceration N/A N/A Wounding Event: Skin Tear N/A N/A Primary Etiology: Asthma, Hypertension, Dementia N/A N/A Comorbid History: 09/08/2022 N/A N/A Date Acquired: 4 N/A N/A Weeks of Treatment: Open N/A N/A Wound Status: No N/A N/A Wound Recurrence: 1.2x0.4x0.2 N/A N/A Measurements L x W x D (cm) 0.377 N/A N/A A (cm) : rea 0.075 N/A N/A Volume (cm) : 96.00% N/A N/A % Reduction in A rea: 98.90% N/A N/A % Reduction in Volume: Full Thickness Without Exposed N/A N/A Classification: Support Structures Medium N/A N/A Exudate A mount: Serosanguineous N/A N/A Exudate Type: red, brown N/A N/A Exudate Color: Medium (34-66%) N/A N/A Granulation A mount: Red N/A N/A Granulation Quality: Medium (34-66%) N/A N/A Necrotic A mount: Fat Layer (Subcutaneous Tissue): Yes N/A N/A Exposed Structures: None N/A N/A Epithelialization: Treatment Notes Electronic Signature(s) Signed: 10/31/2022 12:12:18 PM By: Jeanne Stewart Entered By: Jeanne Stewart on 10/31/2022 07:45:10 -------------------------------------------------------------------------------- Multi-Disciplinary Care Plan Details Patient Name: Date of Service: Jeanne Stewart, Jeanne Stewart. 10/31/2022 10:30 A M Medical Record Number: [id number] Patient Account Number: [id number] Date of Birth/Sex: Treating RN: 27-Feb-1940 (82 y.o. Jeanne Stewart Primary Care Derricka Mertz: Jeanne Stewart Other Clinician: Betha Stewart Referring Kadrian Partch: Treating Delshon Blanchfield/Extender: Jeanne Stewart Weeks in Treatment: 4 Active Inactive Necrotic Tissue Nursing Diagnoses: Impaired tissue integrity related to necrotic/devitalized tissue Knowledge deficit related to management of necrotic/devitalized tissue Jeanne Stewart, Jeanne Stewart ([id number]) [id number] Page 6 of 9 Goals: Necrotic/devitalized tissue will be minimized in the wound bed Date Initiated: 10/02/2022 Target Resolution Date: 11/27/2022 Goal Status: Active Patient/caregiver will verbalize understanding of reason and process for debridement of necrotic tissue Date Initiated: 10/02/2022 Date Inactivated: 10/02/2022 Target Resolution Date: 10/02/2022 Goal Status: Met Interventions: Assess patient pain level pre-, during and post procedure and prior to discharge Provide education on necrotic tissue and debridement process Treatment Activities: Apply topical anesthetic as ordered : 10/02/2022 Excisional debridement : 10/02/2022 Notes: Wound/Skin Impairment Nursing Diagnoses: Impaired tissue integrity Knowledge deficit related to ulceration/compromised skin integrity Goals: Ulcer/skin breakdown will have a volume reduction of 30% by week 4 Date Initiated: 10/02/2022 Target Resolution Date: 10/30/2022 Goal Status: Active Ulcer/skin breakdown will have a volume reduction of 50% by week 8 Date Initiated: 10/02/2022 Target Resolution Date: 11/27/2022 Goal Status: Active Ulcer/skin  breakdown will have a volume reduction of 80% by week 12 Date Initiated: 10/02/2022 Target Resolution Date: 12/25/2022 Goal Status: Active Ulcer/skin breakdown will heal within 14 weeks Date Initiated: 10/02/2022 Target Resolution Date: 01/08/2023 Goal Status: Active Interventions: Assess patient/caregiver ability to obtain necessary supplies Assess patient/caregiver ability to perform ulcer/skin care regimen upon admission and as needed Assess ulceration(s) every visit Provide education on ulcer and skin care Treatment Activities: Skin care regimen initiated : 10/02/2022 Notes: Electronic Signature(s) Signed: 10/31/2022 12:12:18 PM By: Jeanne Stewart Signed: 10/31/2022 1:21:28 PM By: Angelina Pih Entered By: Jeanne Stewart on 10/31/2022 08:12:42 -------------------------------------------------------------------------------- Pain Assessment Details Patient Name: Date of Service: Jeanne Stewart, Jeanne Stewart. 10/31/2022 10:30 A M Medical Record Number: [id number] Patient Account Number: [id number] Date of Birth/Sex: Treating RN: 04/19/1940 (82 y.o. Jeanne Stewart Primary Care Dionta Larke: Jeanne Stewart Other Clinician: Betha Stewart Referring Lemuel Boodram: Treating Alenah Sarria/Extender: Jeanne Stewart Weeks in Treatment: 4 Jeanne Stewart, Jeanne Stewart ([id number]) [id number] Page 7 of 9 Active Problems Location of Pain Severity and Description of  Pain Patient Has Paino No Site Locations Pain Management and Medication Current Pain Management: Electronic Signature(s) Signed: 10/31/2022 12:12:18 PM By: Jeanne Stewart Signed: 10/31/2022 1:21:28 PM By: Angelina Pih Entered By: Jeanne Stewart on 10/31/2022 07:38:44 -------------------------------------------------------------------------------- Patient/Caregiver Education Details Patient Name: Date of Service: Jeanne Stewart 10/25/2024andnbsp10:30 A M Medical Record Number: [id number] Patient Account  Number: [id number] Date of Birth/Gender: Treating RN: [birth year] (82 y.o. Jeanne Stewart Primary Care Physician: Jeanne Stewart Other Clinician: Betha Stewart Referring Physician: Treating Physician/Extender: Jeanne Stewart Weeks in Treatment: 4 Education Assessment Education Provided To: Patient and Caregiver oldest son Education Topics Provided Wound/Skin Impairment: Handouts: Other: continue wound care as directed Methods: Explain/Verbal Responses: State content correctly Electronic Signature(s) Signed: 10/31/2022 12:12:18 PM By: Jeanne Stewart Entered By: Jeanne Stewart on 10/31/2022 08:13:09 Laveda Abbe ([id number]) [id number] Page 8 of 9 -------------------------------------------------------------------------------- Wound Assessment Details Patient Name: Date of Service: Jeanne Stewart, Jeanne Stewart. 10/31/2022 10:30 A M Medical Record Number: [id number] Patient Account Number: [id number] Date of Birth/Sex: Treating RN: Sep 05, 1940 (82 y.o. Jeanne Stewart Primary Care Jonaya Freshour: Jeanne Stewart Other Clinician: Betha Stewart Referring Waverley Krempasky: Treating Anjeanette Petzold/Extender: Jeanne Stewart Weeks in Treatment: 4 Wound Status Wound Number: 1 Primary Etiology: Skin Tear Wound Location: Right Lower Leg Wound Status: Open Wounding Event: Skin Tear/Laceration Comorbid History: Asthma, Hypertension, Dementia Date Acquired: 09/08/2022 Weeks Of Treatment: 4 Clustered Wound: No Photos Wound Measurements Length: (cm) 1.2 Width: (cm) 0.4 Depth: (cm) 0.2 Area: (cm) 0.377 Volume: (cm) 0.075 % Reduction in Area: 96% % Reduction in Volume: 98.9% Epithelialization: None Wound Description Classification: Full Thickness Without Exposed Support Structures Exudate Amount: Medium Exudate Type: Serosanguineous Exudate Color: red, brown Foul Odor After Cleansing: No Slough/Fibrino Yes Wound Bed Granulation Amount: Medium  (34-66%) Exposed Structure Granulation Quality: Red Fat Layer (Subcutaneous Tissue) Exposed: Yes Necrotic Amount: Medium (34-66%) Necrotic Quality: Adherent Slough Treatment Notes Wound #1 (Lower Leg) Wound Laterality: Right Cleanser Byram Ancillary Kit - 15 Day Supply Discharge Instruction: Use supplies as instructed; Kit contains: (15) Saline Bullets; (15) 3x3 Gauze; 15 pr Gloves Soap and Water Discharge Instruction: Gently cleanse wound with antibacterial soap, rinse and pat dry prior to dressing wounds Wound Cleanser Discharge Instruction: Wash your hands with soap and water. Remove old dressing, discard into plastic bag and place into trash. Cleanse the Jeanne Stewart, Jeanne Stewart ([id number]) [id number] Page 9 of 9 wound with Wound Cleanser prior to applying a clean dressing using gauze sponges, not tissues or cotton balls. Do not scrub or use excessive force. Pat dry using gauze sponges, not tissue or cotton balls. Peri-Wound Care Topical Primary Dressing Hydrofera Blue Ready Transfer Foam, 2.5x2.5 (in/in) Discharge Instruction: apply over oil emersion dressing Curad Oil Emulsion Gauze Dressing,3x16 (in/in) Secondary Dressing (BORDER) Zetuvit Plus SILICONE BORDER Dressing 5x5 (in/in) Discharge Instruction: Please do not put silicone bordered dressings under wraps. Use non-bordered dressing only. Secured With Tubigrip Size D, 3x10 (in/yd) Compression Wrap Compression Stockings Add-Ons Electronic Signature(s) Signed: 10/31/2022 12:12:18 PM By: Jeanne Stewart Signed: 10/31/2022 1:21:28 PM By: Angelina Pih Entered By: Jeanne Stewart on 10/31/2022 07:44:03 -------------------------------------------------------------------------------- Vitals Details Patient Name: Date of Service: Jeanne Stewart, Jeanne Stewart. 10/31/2022 10:30 A M Medical Record Number: [id number] Patient Account Number: [id number] Date of Birth/Sex: Treating RN: [birth year] (82 y.o. Jeanne Stewart Primary Care Cully Luckow: Jeanne Stewart Other Clinician: Betha Stewart Referring Dayquan Buys: Treating Miasha Emmons/Extender: Jeanne Stewart Weeks in Treatment: 4 Vital Signs Time Taken: 10:37 Temperature (F): 97.6 Height (in): 60 Pulse (bpm): 65 Weight (  lbs): 130 Respiratory Rate (breaths/min): 18 Body Mass Index (BMI): 25.4 Blood Pressure (mmHg): 131/76 Reference Range: 80 - 120 mg / dl Electronic Signature(s) Signed: 10/31/2022 12:12:18 PM By: Jeanne Stewart Entered By: Jeanne Stewart on 10/31/2022 07:38:39

## 2022-10-31 NOTE — Progress Notes (Addendum)
ATHLEEN, HUGUNIN ([id number]) [id number] Page 1 of 6 Visit Report for 10/31/2022 Chief Complaint Document Details Patient Name: Date of Service: Jeanne Stewart, Jeanne Stewart. 10/31/2022 10:30 A M Medical Record Number: [id number] Patient Account Number: [id number] Date of Birth/Sex: Treating RN: 22-Feb-1940 (82 y.o. [name] Primary Care Provider: [name] Other Clinician: [name] Referring Provider: Treating Provider/Extender: [name] Weeks in Treatment: 4 Information Obtained from: Patient Chief Complaint Skin tear right leg Electronic Signature(s) Signed: 10/31/2022 10:20:16 AM By: Allen Derry PA-C Entered By: Allen Derry on 10/31/2022 07:20:16 -------------------------------------------------------------------------------- HPI Details Patient Name: Date of Service: Jeanne Stewart, Jeanne Stewart. 10/31/2022 10:30 A M Medical Record Number: [id number] Patient Account Number: [id number] Date of Birth/Sex: Treating RN: May 28, 1940 (82 y.o. [name] Primary Care Provider: [name] Other Clinician: [name] Referring Provider: Treating Provider/Extender: [name] Weeks in Treatment: 4 History of Present Illness Chronic/Inactive Conditions Condition 1: 10-02-2022 patient's ABI on the office today was 1.31 on the right on screening. HPI Description: 10-02-2022 upon evaluation today patient presents for initial inspection here in our clinic concerning issues that she has been having with a wound over the right anterior lower extremity. This occurred on September 08, 2022. She states this happened on Labor Day she did not go to the ER. This is a laceration which unfortunately has opened further at this point. There does appear to be some need for sharp debridement to clearway necrotic debris. We are definitely can work on this and try to see what we can do about getting this moving in the right  direction much more effectively and quickly. Patient does have a history of hypertension along with vascular dementia. Unfortunately she takes most dressings off that are put in place and I think that is going to be the biggest issue here. 10-09-2022 upon evaluation today patient appears to be doing well currently in regard to her wound which is actually measuring better and looking better as well today. Fortunately I do not see any signs of active infection which is good news. Unfortunately the biggest issue here is we have been having trouble getting her to keep her dressing in place and intact. With the dementia she is consistently taking this off unfortunately. 10-16-2022 upon evaluation today patient appears to be doing well currently in regard to her wound. She has been tolerating the dressing changes the Hydrofera Blue fortunately there does not appear to be any signs of infection and she seems to be doing quite well. Jeanne Stewart, Jeanne Stewart ([id number]) [id number] Page 2 of 6 10-24-2022 upon evaluation today patient appears to be doing well currently in regard to her wound. She has been tolerating the dressing changes without complication. Fortunately I do not see any signs of worsening her wound actually showing signs of excellent improvement. 10-31-2022 upon evaluation today patient appears to be doing excellent in regard to her wound she is getting very close to complete resolution which is great news. Electronic Signature(s) Signed: 10/31/2022 10:59:21 AM By: Allen Derry PA-C Entered By: Allen Derry on 10/31/2022 07:59:21 -------------------------------------------------------------------------------- Physical Exam Details Patient Name: Date of Service: Jeanne Stewart. 10/31/2022 10:30 A M Medical Record Number: [id number] Patient Account Number: [id number] Date of Birth/Sex: Treating RN: 04/25/1940 (82 y.o. [name] Primary Care Provider: [name] Other Clinician: [name] Referring Provider: Treating Provider/Extender: [name] Weeks in Treatment: 4 Constitutional Well-nourished and well-hydrated in no acute distress. Respiratory normal breathing without difficulty. Psychiatric this  patient is able to make decisions and demonstrates good insight into disease process. Alert and Oriented x 3. pleasant and cooperative. Notes Patient's wound bed actually showed signs of good granulation and epithelization at this point. This is healing quite nicely she is using Hydrofera Blue on the recommend that we have the oil emulsion underneath it to keep it from sticking otherwise we will keep with the same plan. Electronic Signature(s) Signed: 10/31/2022 10:59:35 AM By: Allen Derry PA-C Entered By: Allen Derry on 10/31/2022 07:59:35 -------------------------------------------------------------------------------- Physician Orders Details Patient Name: Date of Service: Jeanne Stewart, Jeanne Stewart. 10/31/2022 10:30 A M Medical Record Number: [id number] Patient Account Number: [id number] Date of Birth/Sex: Treating RN: 1940-09-24 (82 y.o. [name] Primary Care Provider: [name] Other Clinician: [name] Referring Provider: Treating Provider/Extender: [name] Weeks in Treatment: 4 The following information was scribed by: [name] The information was scribed for: Allen Derry Verbal / Phone Orders: No Jeanne Stewart, Jeanne Stewart ([id number]) [id number] Page 3 of 6 Diagnosis Coding ICD-10 Coding Code Description [phone] Laceration without foreign body, right lower leg, initial encounter L97.812 Non-pressure chronic ulcer of other part of right lower leg with fat layer exposed I10 Essential (primary) hypertension F01.A18 Vascular dementia, mild, with other behavioral disturbance Follow-up Appointments Return Appointment in 1 week. Bathing/ Shower/  Hygiene May shower with wound dressing protected with water repellent cover or cast protector. No tub bath. Anesthetic (Use 'Patient Medications' Section for Anesthetic Order Entry) Lidocaine applied to wound bed Wound Treatment Wound #1 - Lower Leg Wound Laterality: Right Cleanser: Byram Ancillary Kit - 15 Day Supply (Generic) 3 x Per Week/15 Days Discharge Instructions: Use supplies as instructed; Kit contains: (15) Saline Bullets; (15) 3x3 Gauze; 15 pr Gloves Cleanser: Soap and Water 3 x Per Week/15 Days Discharge Instructions: Gently cleanse wound with antibacterial soap, rinse and pat dry prior to dressing wounds Cleanser: Wound Cleanser 3 x Per Week/15 Days Discharge Instructions: Wash your hands with soap and water. Remove old dressing, discard into plastic bag and place into trash. Cleanse the wound with Wound Cleanser prior to applying a clean dressing using gauze sponges, not tissues or cotton balls. Do not scrub or use excessive force. Pat dry using gauze sponges, not tissue or cotton balls. Prim Dressing: Hydrofera Blue Ready Transfer Foam, 2.5x2.5 (in/in) 3 x Per Week/15 Days ary Discharge Instructions: apply over oil emersion dressing Prim Dressing: Curad Oil Emulsion Gauze Dressing,3x16 (in/in) ary 3 x Per Week/15 Days Secondary Dressing: (BORDER) Zetuvit Plus SILICONE BORDER Dressing 5x5 (in/in) (Generic) 3 x Per Week/15 Days Discharge Instructions: Please do not put silicone bordered dressings under wraps. Use non-bordered dressing only. Secured With: Tubigrip Size D, 3x10 (in/yd) 3 x Per Week/15 Days Electronic Signature(s) Signed: 10/31/2022 12:12:18 PM By: [name] Signed: 10/31/2022 1:31:37 PM By: Allen Derry PA-C Entered By: [name] on 10/31/2022 08:11:40 -------------------------------------------------------------------------------- Problem List Details Patient Name: Date of Service: Jeanne Stewart, Jeanne Stewart. 10/31/2022 10:30 A M Medical Record Number:  [id number] Patient Account Number: [id number] Date of Birth/Sex: Treating RN: July 12, 1940 (82 y.o. [name] Primary Care Provider: [name] Other Clinician: [name] Referring Provider: Treating Provider/Extender: [name] Weeks in Treatment: [address] Jeanne Stewart, Jeanne Stewart ([id number]) [id number] Page 4 of 6 ICD-10 Encounter Code Description Active Date MDM Diagnosis S81.811A Laceration without foreign body, right lower leg, initial encounter 10/02/2022 No Yes L97.812 Non-pressure chronic ulcer of other part of right lower leg with fat layer 10/02/2022 No Yes exposed I10  Essential (primary) hypertension 10/02/2022 No Yes F01.A18 Vascular dementia, mild, with other behavioral disturbance 10/02/2022 No Yes Inactive Problems Resolved Problems Electronic Signature(s) Signed: 10/31/2022 10:20:13 AM By: Allen Derry PA-C Entered By: Allen Derry on 10/31/2022 07:20:12 -------------------------------------------------------------------------------- Progress Note Details Patient Name: Date of Service: Jeanne Stewart. 10/31/2022 10:30 A M Medical Record Number: [id number] Patient Account Number: [id number] Date of Birth/Sex: Treating RN: 21-Feb-1940 (82 y.o. [name] Primary Care Provider: [name] Other Clinician: [name] Referring Provider: Treating Provider/Extender: [name] Weeks in Treatment: 4 Subjective Chief Complaint Information obtained from Patient Skin tear right leg History of Present Illness (HPI) Chronic/Inactive Condition: 10-02-2022 patient's ABI on the office today was 1.31 on the right on screening. 10-02-2022 upon evaluation today patient presents for initial inspection here in our clinic concerning issues that she has been having with a wound over the right anterior lower extremity. This occurred on September 08, 2022. She states this happened on Labor  Day she did not go to the ER. This is a laceration which unfortunately has opened further at this point. There does appear to be some need for sharp debridement to clearway necrotic debris. We are definitely can work on this and try to see what we can do about getting this moving in the right direction much more effectively and quickly. Patient does have a history of hypertension along with vascular dementia. Unfortunately she takes most dressings off that are put in place and I think that is going to be the biggest issue here. 10-09-2022 upon evaluation today patient appears to be doing well currently in regard to her wound which is actually measuring better and looking better as well today. Fortunately I do not see any signs of active infection which is good news. Unfortunately the biggest issue here is we have been having trouble getting her to keep her dressing in place and intact. With the dementia she is consistently taking this off unfortunately. 10-16-2022 upon evaluation today patient appears to be doing well currently in regard to her wound. She has been tolerating the dressing changes the Hydrofera Blue fortunately there does not appear to be any signs of infection and she seems to be doing quite well. 10-24-2022 upon evaluation today patient appears to be doing well currently in regard to her wound. She has been tolerating the dressing changes without Jeanne Stewart, Jeanne Stewart ([id number]) [id number] Page 5 of 6 complication. Fortunately I do not see any signs of worsening her wound actually showing signs of excellent improvement. 10-31-2022 upon evaluation today patient appears to be doing excellent in regard to her wound she is getting very close to complete resolution which is great news. Objective Constitutional Well-nourished and well-hydrated in no acute distress. Vitals Time Taken: 10:37 AM, Height: 60 in, Weight: 130 lbs, BMI: 25.4, Temperature: 97.6 F, Pulse:  65 bpm, Respiratory Rate: 18 breaths/min, Blood Pressure: 131/76 mmHg. Respiratory normal breathing without difficulty. Psychiatric this patient is able to make decisions and demonstrates good insight into disease process. Alert and Oriented x 3. pleasant and cooperative. General Notes: Patient's wound bed actually showed signs of good granulation and epithelization at this point. This is healing quite nicely she is using Hydrofera Blue on the recommend that we have the oil emulsion underneath it to keep it from sticking otherwise we will keep with the same plan. Integumentary (Hair, Skin) Wound #1 status is Open. Original cause of wound was Skin Tear/Laceration. The date acquired was: 09/08/2022. The wound has been in  treatment 4 weeks. The wound is located on the Right Lower Leg. The wound measures 1.2cm length x 0.4cm width x 0.2cm depth; 0.377cm^2 area and 0.075cm^3 volume. There is Fat Layer (Subcutaneous Tissue) exposed. There is a medium amount of serosanguineous drainage noted. There is medium (34-66%) red granulation within the wound bed. There is a medium (34-66%) amount of necrotic tissue within the wound bed including Adherent Slough. Assessment Active Problems ICD-10 Laceration without foreign body, right lower leg, initial encounter Non-pressure chronic ulcer of other part of right lower leg with fat layer exposed Essential (primary) hypertension Vascular dementia, mild, with other behavioral disturbance Plan 1. Will go ahead and plan to see the patient back for follow-up visit continue with the Hydrofera Blue and oil emulsion which I think is doing a really good job. 2. We are going to plan to see her in 2 weeks at this point I think that skin to be the best way to go currently. They are doing a great job take care of at home which will probably be healed by that time. We will see patient back for reevaluation in 1 week here in the clinic. If anything worsens or changes patient  will contact our office for additional recommendations. Electronic Signature(s) Signed: 10/31/2022 10:59:57 AM By: Allen Derry PA-C Entered By: Allen Derry on 10/31/2022 07:59:57 Jeanne Stewart ([id number]) [id number] Page 6 of 6 -------------------------------------------------------------------------------- SuperBill Details Patient Name: Date of Service: Jeanne Stewart, Jeanne Stewart 10/31/2022 Medical Record Number: [id number] Patient Account Number: [id number] Date of Birth/Sex: Treating RN: [birth year] (82 y.o. [name] Primary Care Provider: [name] Other Clinician: [name] Referring Provider: Treating Provider/Extender: [name] Weeks in Treatment: 4 Diagnosis Coding ICD-10 Codes Code Description [phone] Laceration without foreign body, right lower leg, initial encounter L97.812 Non-pressure chronic ulcer of other part of right lower leg with fat layer exposed I10 Essential (primary) hypertension F01.A18 Vascular dementia, mild, with other behavioral disturbance Facility Procedures : CPT4 Code: [id number] Description: 99213 - WOUND CARE VISIT-LEV 3 EST PT Modifier: Quantity: 1 Physician Procedures : CPT4 Code Description Modifier [id number] 99213 - WC PHYS LEVEL 3 - EST PT ICD-10 Diagnosis Description S81.811A Laceration without foreign body, right lower leg, initial encounter L97.812 Non-pressure chronic ulcer of other part of right lower leg with  fat layer exposed I10 Essential (primary) hypertension F01.A18 Vascular dementia, mild, with other behavioral disturbance Quantity: 1 Electronic Signature(s) Signed: 10/31/2022 12:12:18 PM By: [name] Signed: 10/31/2022 1:31:37 PM By: Allen Derry PA-C Previous Signature: 10/31/2022 11:00:10 AM Version By: Allen Derry PA-C Entered By: [name] on 10/31/2022 08:02:27

## 2022-11-10 ENCOUNTER — Ambulatory Visit: Payer: Medicare HMO | Admitting: Physician Assistant

## 2022-11-17 ENCOUNTER — Encounter: Payer: Medicare HMO | Attending: Physician Assistant | Admitting: Physician Assistant

## 2022-11-17 DIAGNOSIS — S81811A Laceration without foreign body, right lower leg, initial encounter: Secondary | ICD-10-CM | POA: Diagnosis present

## 2022-11-17 DIAGNOSIS — F01A18 Vascular dementia, mild, with other behavioral disturbance: Secondary | ICD-10-CM | POA: Diagnosis not present

## 2022-11-17 DIAGNOSIS — I10 Essential (primary) hypertension: Secondary | ICD-10-CM | POA: Diagnosis not present

## 2022-11-17 DIAGNOSIS — L97812 Non-pressure chronic ulcer of other part of right lower leg with fat layer exposed: Secondary | ICD-10-CM | POA: Diagnosis not present

## 2022-11-17 NOTE — Progress Notes (Addendum)
Jeanne Stewart ([id number]) [id number] Page 1 of 6 Visit Report for 11/17/2022 Chief Complaint Document Details Patient Name: Date of Service: Jeanne Stewart, Jeanne Stewart 11/17/2022 3:45 PM Medical Record Number: [id number] Patient Account Number: [id number] Date of Birth/Sex: Treating RN: 1940/10/16 (82 y.o. Jeanne Stewart Primary Care Provider: [name] Other Clinician: [name] Referring Provider: Treating Provider/Extender: [name] Weeks in Treatment: 6 Information Obtained from: Patient Chief Complaint Skin tear right leg Electronic Signature(s) Signed: 11/17/2022 3:43:34 PM By: Allen Derry PA-C Entered By: Allen Derry on 11/17/2022 12:43:34 -------------------------------------------------------------------------------- HPI Details Patient Name: Date of Service: Jeanne Stewart Smiling, Jeanne Stewart. 11/17/2022 3:45 PM Medical Record Number: [id number] Patient Account Number: [id number] Date of Birth/Sex: Treating RN: 1940-05-27 (82 y.o. Jeanne Stewart Primary Care Provider: [name] Other Clinician: [name] Referring Provider: Treating Provider/Extender: [name] Weeks in Treatment: 6 History of Present Illness Chronic/Inactive Conditions Condition 1: 10-02-2022 patient's ABI on the office today was 1.31 on the right on screening. HPI Description: 10-02-2022 upon evaluation today patient presents for initial inspection here in our clinic concerning issues that she has been having with a wound over the right anterior lower extremity. This occurred on September 08, 2022. She states this happened on Labor Day she did not go to the ER. This is a laceration which unfortunately has opened further at this point. There does appear to be some need for sharp debridement to clearway necrotic debris. We are definitely can work on this and try to see what we can do about getting this moving in the right direction much  more effectively and quickly. Patient does have a history of hypertension along with vascular dementia. Unfortunately she takes most dressings off that are put in place and I think that is going to be the biggest issue here. 10-09-2022 upon evaluation today patient appears to be doing well currently in regard to her wound which is actually measuring better and looking better as well today. Fortunately I do not see any signs of active infection which is good news. Unfortunately the biggest issue here is we have been having trouble getting her to keep her dressing in place and intact. With the dementia she is consistently taking this off unfortunately. 10-16-2022 upon evaluation today patient appears to be doing well currently in regard to her wound. She has been tolerating the dressing changes the Hydrofera Blue fortunately there does not appear to be any signs of infection and she seems to be doing quite well. Jeanne Stewart, Jeanne Stewart ([id number]) [id number] Page 2 of 6 10-24-2022 upon evaluation today patient appears to be doing well currently in regard to her wound. She has been tolerating the dressing changes without complication. Fortunately I do not see any signs of worsening her wound actually showing signs of excellent improvement. 10-31-2022 upon evaluation today patient appears to be doing excellent in regard to her wound she is getting very close to complete resolution which is great news. 11-17-2022 upon evaluation today patient appears to be doing excellent in regard to her wound. In fact this appears to be completely healed based on what I am seeing. Electronic Signature(s) Signed: 11/17/2022 4:22:20 PM By: Allen Derry PA-C Entered By: Allen Derry on 11/17/2022 13:22:20 -------------------------------------------------------------------------------- Physical Exam Details Patient Name: Date of Service: Jeanne Stewart, Jeanne Stewart 11/17/2022 3:45 PM Medical Record Number:  [id number] Patient Account Number: [id number] Date of Birth/Sex: Treating RN: 1940-01-16 (82 y.o. Jeanne Stewart Primary Care Provider: [name] Other Clinician: [name] Referring  Provider: Treating Provider/Extender: [name] Weeks in Treatment: 6 Constitutional Well-nourished and well-hydrated in no acute distress. Respiratory normal breathing without difficulty. Psychiatric this patient is able to make decisions and demonstrates good insight into disease process. Alert and Oriented x 3. pleasant and cooperative. Notes Upon evaluation patient's wound actually appears to be completely healed which is great news and I am very pleased in that regard. I do not see any signs of active infection at this time. With that being said there is a little bit of an abrasion just below the wound though completely separate from that I do see signs of irritation here it does not appear to be infected but I want a make sure that it clears up I would recommend topical mupirocin ointment on medicine that into the pharmacy for her to be put on twice a day until resolved. Electronic Signature(s) Signed: 11/17/2022 4:23:00 PM By: Allen Derry PA-C Entered By: Allen Derry on 11/17/2022 13:23:00 -------------------------------------------------------------------------------- Physician Orders Details Patient Name: Date of Service: Jeanne Stewart Smiling, Jeanne Stewart. 11/17/2022 3:45 PM Medical Record Number: [id number] Patient Account Number: [id number] Date of Birth/Sex: Treating RN: 24-Jan-1940 (81 y.o. Jeanne Stewart Primary Care Provider: [name] Other Clinician: [name] Referring Provider: Treating Provider/Extender: [name] [place] ([id number]) [phone].pdf Page 3 of 6 Weeks in Treatment: 6 The following information was scribed by: [name] The information was scribed for: Allen Derry Verbal / Phone Orders:  No Diagnosis Coding ICD-10 Coding Code Description [phone] Laceration without foreign body, right lower leg, initial encounter L97.812 Non-pressure chronic ulcer of other part of right lower leg with fat layer exposed I10 Essential (primary) hypertension F01.A18 Vascular dementia, mild, with other behavioral disturbance Discharge From [facility] Services Discharge from Wound Care Center Treatment Complete Additional Orders / Instructions Other: - Apply Mupirocin antibiotic ointment to reddened area on right lower leg twice a day Patient Medications llergies: No Known Drug Allergies A Notifications Medication Indication Start End 11/17/2022 mupirocin DOSE topical 2 % ointment - ointment topical twice a day x 30 days until healed Electronic Signature(s) Signed: 11/17/2022 4:24:15 PM By: Allen Derry PA-C Entered By: Allen Derry on 11/17/2022 13:24:15 -------------------------------------------------------------------------------- Problem List Details Patient Name: Date of Service: Jeanne Stewart Smiling, Jeanne Stewart. 11/17/2022 3:45 PM Medical Record Number: [id number] Patient Account Number: [id number] Date of Birth/Sex: Treating RN: [birth year] (81 y.o. Jeanne Stewart Primary Care Provider: [name] Other Clinician: [name] Referring Provider: Treating Provider/Extender: [name] Weeks in Treatment: 6 Active Problems ICD-10 Encounter Code Description Active Date MDM Diagnosis S81.811A Laceration without foreign body, right lower leg, initial encounter 10/02/2022 No Yes L97.812 Non-pressure chronic ulcer of other part of right lower leg with fat layer 10/02/2022 No Yes exposed I10 Essential (primary) hypertension 10/02/2022 No Yes Jeanne Stewart, Jeanne Stewart ([id number]) [id number] Page 4 of 6 F01.A18 Vascular dementia, mild, with other behavioral disturbance 10/02/2022 No Yes Inactive Problems Resolved Problems Electronic Signature(s) Signed:  11/17/2022 3:43:29 PM By: Allen Derry PA-C Entered By: Allen Derry on 11/17/2022 12:43:29 -------------------------------------------------------------------------------- Progress Note Details Patient Name: Date of Service: Jeanne Stewart Smiling, Jeanne Stewart. 11/17/2022 3:45 PM Medical Record Number: [id number] Patient Account Number: [id number] Date of Birth/Sex: Treating RN: 30-Nov-1940 (82 y.o. Jeanne Stewart Primary Care Provider: [name] Other Clinician: [name] Referring Provider: Treating Provider/Extender: [name] Weeks in Treatment: 6 Subjective Chief Complaint Information obtained from Patient Skin tear right leg History of Present Illness (HPI) Chronic/Inactive Condition: 10-02-2022 patient's ABI on the  office today was 1.31 on the right on screening. 10-02-2022 upon evaluation today patient presents for initial inspection here in our clinic concerning issues that she has been having with a wound over the right anterior lower extremity. This occurred on September 08, 2022. She states this happened on Labor Day she did not go to the ER. This is a laceration which unfortunately has opened further at this point. There does appear to be some need for sharp debridement to clearway necrotic debris. We are definitely can work on this and try to see what we can do about getting this moving in the right direction much more effectively and quickly. Patient does have a history of hypertension along with vascular dementia. Unfortunately she takes most dressings off that are put in place and I think that is going to be the biggest issue here. 10-09-2022 upon evaluation today patient appears to be doing well currently in regard to her wound which is actually measuring better and looking better as well today. Fortunately I do not see any signs of active infection which is good news. Unfortunately the biggest issue here is we have been having trouble getting her to keep her  dressing in place and intact. With the dementia she is consistently taking this off unfortunately. 10-16-2022 upon evaluation today patient appears to be doing well currently in regard to her wound. She has been tolerating the dressing changes the Hydrofera Blue fortunately there does not appear to be any signs of infection and she seems to be doing quite well. 10-24-2022 upon evaluation today patient appears to be doing well currently in regard to her wound. She has been tolerating the dressing changes without complication. Fortunately I do not see any signs of worsening her wound actually showing signs of excellent improvement. 10-31-2022 upon evaluation today patient appears to be doing excellent in regard to her wound she is getting very close to complete resolution which is great news. 11-17-2022 upon evaluation today patient appears to be doing excellent in regard to her wound. In fact this appears to be completely healed based on what I am seeing. [address] Jeanne Stewart, Jeanne Stewart ([id number]) [id number] Page 5 of 6 Constitutional Well-nourished and well-hydrated in no acute distress. Vitals Time Taken: 3:43 PM, Height: 60 in, Weight: 130 lbs, BMI: 25.4, Temperature: 98.1 F, Pulse: 67 bpm, Respiratory Rate: 18 breaths/min, Blood Pressure: 134/77 mmHg. Respiratory normal breathing without difficulty. Psychiatric this patient is able to make decisions and demonstrates good insight into disease process. Alert and Oriented x 3. pleasant and cooperative. General Notes: Upon evaluation patient's wound actually appears to be completely healed which is great news and I am very pleased in that regard. I do not see any signs of active infection at this time. With that being said there is a little bit of an abrasion just below the wound though completely separate from that I do see signs of irritation here it does not appear to be infected but I want a make sure that it clears  up I would recommend topical mupirocin ointment on medicine that into the pharmacy for her to be put on twice a day until resolved. Integumentary (Hair, Skin) Wound #1 status is Healed - Epithelialized. Original cause of wound was Skin Tear/Laceration. The date acquired was: 09/08/2022. The wound has been in treatment 6 weeks. The wound is located on the Right Lower Leg. The wound measures 0cm length x 0cm width x 0cm depth; 0cm^2 area and 0cm^3 volume. There is Fat Layer (Subcutaneous  Tissue) exposed. There is a none present amount of drainage noted. There is no granulation within the wound bed. There is no necrotic tissue within the wound bed. Assessment Active Problems ICD-10 Laceration without foreign body, right lower leg, initial encounter Non-pressure chronic ulcer of other part of right lower leg with fat layer exposed Essential (primary) hypertension Vascular dementia, mild, with other behavioral disturbance Plan Discharge From [facility] Services: Discharge from Wound Care Center Treatment Complete Additional Orders / Instructions: Other: - Apply Mupirocin antibiotic ointment to reddened area on right lower leg twice a day The following medication(s) was prescribed: mupirocin topical 2 % ointment ointment topical twice a day x 30 days until healed starting 11/17/2022 1. I would recommend currently that the patient is ready for discharge as far as original wound is concerned. With regard to the abrasion I am to go ahead and send in a prescription for mupirocin ointment I think this should do well as far as keeping this from developing anything more significant she is in agreement with that plan although with the dementia I make sure that her son was aware as well he is. 2. I would recommend that nothing needs to be covered as far as any dressings are concerned over the area I think she is doing quite well otherwise. We will see her back for a follow-up visit as needed. Additional  recommendations. Electronic Signature(s) Signed: 11/17/2022 4:24:39 PM By: Allen Derry PA-C Entered By: Allen Derry on 11/17/2022 13:24:39 -------------------------------------------------------------------------------- SuperBill Details Patient Name: Date of Service: Jeanne Stewart Smiling, Jeanne Stewart 11/17/2022 Jeanne Stewart ([id number]) [id number] Page 6 of 6 Medical Record Number: [id number] Patient Account Number: [id number] Date of Birth/Sex: Treating RN: 30-Jul-1940 (82 y.o. Jeanne Stewart Primary Care Provider: [name] Other Clinician: [name] Referring Provider: Treating Provider/Extender: [name] Weeks in Treatment: 6 Diagnosis Coding ICD-10 Codes Code Description [phone] Laceration without foreign body, right lower leg, initial encounter L97.812 Non-pressure chronic ulcer of other part of right lower leg with fat layer exposed I10 Essential (primary) hypertension F01.A18 Vascular dementia, mild, with other behavioral disturbance Facility Procedures : CPT4 Code: [id number] Description: [phone] - WOUND CARE VISIT-LEV 2 EST PT Modifier: Quantity: 1 Physician Procedures : CPT4 Code Description Modifier [id number] - WC PHYS LEVEL 4 - EST PT ICD-10 Diagnosis Description S81.811A Laceration without foreign body, right lower leg, initial encounter L97.812 Non-pressure chronic ulcer of other part of right lower leg with  fat layer exposed I10 Essential (primary) hypertension F01.A18 Vascular dementia, mild, with other behavioral disturbance Quantity: 1 Electronic Signature(s) Signed: 11/17/2022 4:25:03 PM By: Allen Derry PA-C Previous Signature: 11/17/2022 4:24:51 PM Version By: Allen Derry PA-C Entered By: Allen Derry on 11/17/2022 13:25:03

## 2022-11-19 NOTE — Progress Notes (Signed)
KERRILYN, WILLDEN ([id number]) [id number] Page 1 of 8 Visit Report for 11/17/2022 Arrival Information Details Patient Name: Date of Service: Jeanne Stewart, Jeanne Stewart 11/17/2022 3:45 PM Medical Record Number: [id number] Patient Account Number: [id number] Date of Birth/Sex: Treating RN: May 20, 1940 (82 y.o. Freddy Finner Primary Care Salam Micucci: [name] Other Clinician: [name] Referring Elina Streng: Treating Stephane Junkins/Extender: [name] Weeks in Treatment: 6 Visit Information History Since Last Visit All ordered tests and consults were completed: No Patient Arrived: Ambulatory Added or deleted any medications: No Arrival Time: 15:38 Any new allergies or adverse reactions: No Transfer Assistance: None Had a fall or experienced change in No Patient Identification Verified: Yes activities of daily living that may affect Secondary Verification Process Completed: Yes risk of falls: Patient Requires Transmission-Based Precautions: No Signs or symptoms of abuse/neglect since last visito No Patient Has Alerts: No Hospitalized since last visit: No Implantable device outside of the clinic excluding No cellular tissue based products placed in the center since last visit: Has Dressing in Place as Prescribed: Yes Pain Present Now: No Electronic Signature(s) Signed: 11/17/2022 4:35:08 PM By: [name] Entered By: [name] on 11/17/2022 15:43:20 -------------------------------------------------------------------------------- Clinic Level of Care Assessment Details Patient Name: Date of Service: TAREVA, LEVEN 11/17/2022 3:45 PM Medical Record Number: [id number] Patient Account Number: [id number] Date of Birth/Sex: Treating RN: 22-Jan-1940 (82 y.o. Freddy Finner Primary Care Satoshi Kalas: [name] Other Clinician: [name] Referring Shirlena Brinegar: Treating Lyndol Vanderheiden/Extender: [name] Weeks in  Treatment: 6 Clinic Level of Care Assessment Items TOOL 4 Quantity Score []  - 0 Use when only an EandM is performed on FOLLOW-UP visit ASSESSMENTS - Nursing Assessment / Reassessment X- 1 10 Reassessment of Co-morbidities (includes updates in patient status) X- 1 5 Reassessment of Adherence to Treatment Plan CONOR, CAMHI ([id number]) [id number] Page 2 of 8 ASSESSMENTS - Wound and Skin A ssessment / Reassessment X - Simple Wound Assessment / Reassessment - one wound 1 5 []  - 0 Complex Wound Assessment / Reassessment - multiple wounds []  - 0 Dermatologic / Skin Assessment (not related to wound area) ASSESSMENTS - Focused Assessment []  - 0 Circumferential Edema Measurements - multi extremities []  - 0 Nutritional Assessment / Counseling / Intervention []  - 0 Lower Extremity Assessment (monofilament, tuning fork, pulses) []  - 0 Peripheral Arterial Disease Assessment (using hand held doppler) ASSESSMENTS - Ostomy and/or Continence Assessment and Care []  - 0 Incontinence Assessment and Management []  - 0 Ostomy Care Assessment and Management (repouching, etc.) PROCESS - Coordination of Care X - Simple Patient / Family Education for ongoing care 1 15 []  - 0 Complex (extensive) Patient / Family Education for ongoing care []  - 0 Staff obtains Chiropractor, Records, T Results / Process Orders est []  - 0 Staff telephones HHA, Nursing Homes / Clarify orders / etc []  - 0 Routine Transfer to another Facility (non-emergent condition) []  - 0 Routine Hospital Admission (non-emergent condition) []  - 0 New Admissions / Manufacturing engineer / Ordering NPWT Apligraf, etc. , []  - 0 Emergency Hospital Admission (emergent condition) X- 1 10 Simple Discharge Coordination []  - 0 Complex (extensive) Discharge Coordination PROCESS - Special Needs []  - 0 Pediatric / Minor Patient Management []  - 0 Isolation Patient Management []  - 0 Hearing / Language /  Visual special needs []  - 0 Assessment of Community assistance (transportation, D/C planning, etc.) []  - 0 Additional assistance / Altered mentation []  - 0 Support Surface(s) Assessment (bed, cushion, seat, etc.) INTERVENTIONS - Wound Cleansing /  Measurement X - Simple Wound Cleansing - one wound 1 5 []  - 0 Complex Wound Cleansing - multiple wounds X- 1 5 Wound Imaging (photographs - any number of wounds) []  - 0 Wound Tracing (instead of photographs) []  - 0 Simple Wound Measurement - one wound []  - 0 Complex Wound Measurement - multiple wounds INTERVENTIONS - Wound Dressings []  - 0 Small Wound Dressing one or multiple wounds []  - 0 Medium Wound Dressing one or multiple wounds []  - 0 Large Wound Dressing one or multiple wounds X- 1 5 Application of Medications - topical []  - 0 Application of Medications - injection INTERVENTIONS - Miscellaneous []  - 0 External ear exam BETSEY, GERSTLE ([id number]) [id number] Page 3 of 8 []  - 0 Specimen Collection (cultures, biopsies, blood, body fluids, etc.) []  - 0 Specimen(s) / Culture(s) sent or taken to Lab for analysis []  - 0 Patient Transfer (multiple staff / Michiel Sites Lift / Similar devices) []  - 0 Simple Staple / Suture removal (25 or less) []  - 0 Complex Staple / Suture removal (26 or more) []  - 0 Hypo / Hyperglycemic Management (close monitor of Blood Glucose) []  - 0 Ankle / Brachial Index (ABI) - do not check if billed separately X- 1 5 Vital Signs Has the patient been seen at the hospital within the last three years: Yes Total Score: 65 Level Of Care: New/Established - Level 2 Electronic Signature(s) Signed: 11/17/2022 4:35:08 PM By: [name] Entered By: [name] on 11/17/2022 16:05:30 -------------------------------------------------------------------------------- Encounter Discharge Information Details Patient Name: Date of Service: [name], [name]. 11/17/2022 3:45  PM Medical Record Number: [id number] Patient Account Number: [id number] Date of Birth/Sex: Treating RN: 1940/07/22 (82 y.o. Freddy Finner Primary Care Rocko Fesperman: [name] Other Clinician: [name] Referring Ashyia Schraeder: Treating Reid Nawrot/Extender: [name] Weeks in Treatment: 6 Encounter Discharge Information Items Discharge Condition: Stable Ambulatory Status: Ambulatory Discharge Destination: Home Transportation: Private Auto Accompanied By: son Schedule Follow-up Appointment: Yes Clinical Summary of Care: Electronic Signature(s) Signed: 11/17/2022 4:35:08 PM By: [name] Entered By: [name] on 11/17/2022 16:20:50 Lower Extremity Assessment Details -------------------------------------------------------------------------------- Laveda Abbe ([id number]) [id number] Page 4 of 8 Patient Name: Date of Service: OLIE, TEAT 11/17/2022 3:45 PM Medical Record Number: [id number] Patient Account Number: [id number] Date of Birth/Sex: Treating RN: 08/07/1940 (82 y.o. Freddy Finner Primary Care Delecia Vastine: [name] Other Clinician: [name] Referring Clarke Peretz: Treating Harmani Neto/Extender: [name] Weeks in Treatment: 6 Edema Assessment Left: Right: Assessed: No Yes Edema: Yes Calf Left: Right: Point of Measurement: 30 cm From Medial Instep 32 cm Ankle Left: Right: Point of Measurement: 11 cm From Medial Instep 19.5 cm Vascular Assessment Left: Right: Pulses: Dorsalis Pedis Palpable: Yes Extremity colors, hair growth, and conditions: Extremity Color: Normal Hair Growth on Extremity: No Temperature of Extremity: Warm Capillary Refill: < 3 seconds Toe Nail Assessment Left: Right: Thick: No Discolored: No Deformed: No Improper Length and Hygiene: No Electronic Signature(s) Signed: 11/17/2022 4:35:08 PM By: [name] Signed: 11/19/2022 4:42:48 PM By: Yevonne Pax RN Entered By: [name] on 11/17/2022 15:49:59 -------------------------------------------------------------------------------- Multi Wound Chart Details Patient Name: Date of Service: [name], [name]. 11/17/2022 3:45 PM Medical Record Number: [id number] Patient Account Number: [id number] Date of Birth/Sex: Treating RN: Jul 11, 1940 (81 y.o. Freddy Finner Primary Care Jayceion Lisenby: [name] Other Clinician: [name] Referring Yoneko Talerico: Treating Buddy Loeffelholz/Extender: [name] Weeks in Treatment: 6 Vital Signs Height(in): 60 Pulse(bpm): 67 Weight(lbs): 130 Blood Pressure(mmHg): 134/77 Body  Mass Index(BMI): 25.4 Temperature(F): 98.1 Respiratory Rate(breaths/min): [address] ([id number]) [id number] Page 5 of 8 [1:Photos:] [N/A:N/A] Right Lower Leg N/A N/A Wound Location: Skin Tear/Laceration N/A N/A Wounding Event: Skin Tear N/A N/A Primary Etiology: Asthma, Hypertension, Dementia N/A N/A Comorbid History: 09/08/2022 N/A N/A Date Acquired: 6 N/A N/A Weeks of Treatment: Healed - Epithelialized N/A N/A Wound Status: No N/A N/A Wound Recurrence: 0x0x0 N/A N/A Measurements L x W x D (cm) 0 N/A N/A A (cm) : rea 0 N/A N/A Volume (cm) : 100.00% N/A N/A % Reduction in A rea: 100.00% N/A N/A % Reduction in Volume: Full Thickness Without Exposed N/A N/A Classification: Support Structures None Present N/A N/A Exudate A mount: None Present (0%) N/A N/A Granulation A mount: None Present (0%) N/A N/A Necrotic A mount: Fat Layer (Subcutaneous Tissue): Yes N/A N/A Exposed Structures: Large (67-100%) N/A N/A Epithelialization: Treatment Notes Electronic Signature(s) Signed: 11/17/2022 4:35:08 PM By: [name] Entered By: [name] on 11/17/2022 16:03:38 -------------------------------------------------------------------------------- Multi-Disciplinary Care Plan Details Patient Name:  Date of Service: [name], [name]. 11/17/2022 3:45 PM Medical Record Number: [id number] Patient Account Number: [id number] Date of Birth/Sex: Treating RN: April 14, 1940 (82 y.o. Freddy Finner Primary Care Ivonne [place]burg: [name] Other Clinician: [name] Referring Dempsy Damiano: Treating Geraldine Tesar/Extender: [name] Weeks in Treatment: 6 Active Inactive Electronic Signature(s) Signed: 11/17/2022 4:35:08 PM By: [name] Signed: 11/19/2022 4:42:48 PM By: Yevonne Pax RN Entered By: [name] on 11/17/2022 16:19:17 Laveda Abbe ([id number]) [id number] Page 6 of 8 -------------------------------------------------------------------------------- Pain Assessment Details Patient Name: Date of Service: LAKESHIA, CURTI. 11/17/2022 3:45 PM Medical Record Number: [id number] Patient Account Number: [id number] Date of Birth/Sex: Treating RN: July 23, 1940 (82 y.o. Freddy Finner Primary Care Alixandrea Milleson: [name] Other Clinician: [name] Referring Zaylan Kissoon: Treating Koua Deeg/Extender: [name] Weeks in Treatment: 6 Active Problems Location of Pain Severity and Description of Pain Patient Has Paino No Site Locations Pain Management and Medication Current Pain Management: Electronic Signature(s) Signed: 11/17/2022 4:35:08 PM By: [name] Signed: 11/19/2022 4:42:48 PM By: Yevonne Pax RN Entered By: [name] on 11/17/2022 15:45:17 -------------------------------------------------------------------------------- Patient/Caregiver Education Details Patient Name: Date of Service: [name] 11/11/2024andnbsp3:45 PM Medical Record Number: [id number] Patient Account Number: [id number] Date of Birth/Gender: Treating RN: October 07, 1940 (82 y.o. Freddy Finner Primary Care Physician: [name] Other Clinician: [name] Referring Physician: Treating Physician/Extender: [name] Weeks in Treatment: 6 YULMA, POSTLETHWAITE [place] ([id number]) [id number] Page 7 of 8 Education Assessment Education Provided To: Patient Education Topics Provided Wound/Skin Impairment: Handouts: Other: Congratulations! your wound has healed. Please call if any further issues arise Methods: Explain/Verbal Responses: State content correctly Electronic Signature(s) Signed: 11/17/2022 4:35:08 PM By: [name] Entered By: [name] on 11/17/2022 16:19:55 -------------------------------------------------------------------------------- Wound Assessment Details Patient Name: Date of Service: [name]. 11/17/2022 3:45 PM Medical Record Number: [id number] Patient Account Number: [id number] Date of Birth/Sex: Treating RN: 07/14/1940 (82 y.o. Freddy Finner Primary Care Sumedha Munnerlyn: [name] Other Clinician: [name] Referring Avelyn Touch: Treating Pasty Manninen/Extender: [name] Weeks in Treatment: 6 Wound Status Wound Number: 1 Primary Etiology: Skin Tear Wound Location: Right Lower Leg Wound Status: Healed - Epithelialized Wounding Event: Skin Tear/Laceration Comorbid History: Asthma, Hypertension, Dementia Date Acquired: 09/08/2022 Weeks Of Treatment: 6 Clustered Wound: No Photos Wound Measurements Length: (cm) Width: (cm) Depth: (cm) Area: (cm) Volume: (cm) 0 % Reduction in Area: 100% 0 % Reduction in Volume: 100% 0 Epithelialization: Large (  67-100%) 0 0 Wound Description Classification: Full Thickness Without Exposed Support Structures Exudate Amount: None Present HELANE, VANROY ([id number]) Foul Odor After Cleansing: No Slough/Fibrino No [id number] Page 8 of 8 Wound Bed Granulation Amount: None Present (0%) Exposed Structure Necrotic Amount: None Present (0%) Fat Layer (Subcutaneous Tissue) Exposed: Yes Treatment Notes Wound #1 (Lower Leg) Wound  Laterality: Right Cleanser Peri-Wound Care Topical Primary Dressing Secondary Dressing Secured With Compression Wrap Compression Stockings Add-Ons Electronic Signature(s) Signed: 11/17/2022 4:35:08 PM By: [name] Signed: 11/19/2022 4:42:48 PM By: Yevonne Pax RN Entered By: [name] on 11/17/2022 16:03:26 -------------------------------------------------------------------------------- Vitals Details Patient Name: Date of Service: [name], [name]. 11/17/2022 3:45 PM Medical Record Number: [id number] Patient Account Number: [id number] Date of Birth/Sex: Treating RN: 1940/06/07 (82 y.o. Freddy Finner Primary Care Versie Soave: [name] Other Clinician: [name] Referring Lige Lakeman: Treating Solmon Bohr/Extender: [name] Weeks in Treatment: 6 Vital Signs Time Taken: 15:43 Temperature (F): 98.1 Height (in): 60 Pulse (bpm): 67 Weight (lbs): 130 Respiratory Rate (breaths/min): 18 Body Mass Index (BMI): 25.4 Blood Pressure (mmHg): 134/77 Reference Range: 80 - 120 mg / dl Electronic Signature(s) Signed: 11/17/2022 4:35:08 PM By: [name] Entered By: [name] on 11/17/2022 15:45:13

## 2023-02-05 ENCOUNTER — Emergency Department: Payer: Medicare HMO

## 2023-02-05 ENCOUNTER — Observation Stay
Admission: EM | Admit: 2023-02-05 | Discharge: 2023-02-06 | Disposition: A | Discharge status: Home / Self Care | Payer: Medicare HMO | Attending: Family Medicine | Admitting: Family Medicine

## 2023-02-05 ENCOUNTER — Other Ambulatory Visit: Payer: Self-pay

## 2023-02-05 DIAGNOSIS — R739 Hyperglycemia, unspecified: Secondary | ICD-10-CM | POA: Diagnosis not present

## 2023-02-05 DIAGNOSIS — F03918 Unspecified dementia, unspecified severity, with other behavioral disturbance: Secondary | ICD-10-CM | POA: Diagnosis present

## 2023-02-05 DIAGNOSIS — Z79899 Other long term (current) drug therapy: Secondary | ICD-10-CM | POA: Diagnosis not present

## 2023-02-05 DIAGNOSIS — F0390 Unspecified dementia without behavioral disturbance: Secondary | ICD-10-CM | POA: Diagnosis not present

## 2023-02-05 DIAGNOSIS — J9601 Acute respiratory failure with hypoxia: Secondary | ICD-10-CM | POA: Diagnosis not present

## 2023-02-05 DIAGNOSIS — E785 Hyperlipidemia, unspecified: Secondary | ICD-10-CM | POA: Diagnosis present

## 2023-02-05 DIAGNOSIS — J441 Chronic obstructive pulmonary disease with (acute) exacerbation: Secondary | ICD-10-CM | POA: Diagnosis present

## 2023-02-05 DIAGNOSIS — J45901 Unspecified asthma with (acute) exacerbation: Secondary | ICD-10-CM | POA: Diagnosis not present

## 2023-02-05 DIAGNOSIS — J101 Influenza due to other identified influenza virus with other respiratory manifestations: Secondary | ICD-10-CM | POA: Diagnosis present

## 2023-02-05 DIAGNOSIS — Z1152 Encounter for screening for COVID-19: Secondary | ICD-10-CM | POA: Diagnosis not present

## 2023-02-05 DIAGNOSIS — I10 Essential (primary) hypertension: Secondary | ICD-10-CM | POA: Diagnosis present

## 2023-02-05 DIAGNOSIS — R0602 Shortness of breath: Secondary | ICD-10-CM | POA: Diagnosis present

## 2023-02-05 DIAGNOSIS — J09X9 Influenza due to identified novel influenza A virus with other manifestations: Principal | ICD-10-CM | POA: Insufficient documentation

## 2023-02-05 DIAGNOSIS — Z66 Do not resuscitate: Secondary | ICD-10-CM | POA: Diagnosis not present

## 2023-02-05 DIAGNOSIS — R0902 Hypoxemia: Principal | ICD-10-CM

## 2023-02-05 LAB — CBC WITH DIFFERENTIAL/PLATELET
Abs Immature Granulocytes: 0 10*3/uL (ref 0.00–0.07)
Basophils Absolute: 0 10*3/uL (ref 0.0–0.1)
Basophils Relative: 0 %
Eosinophils Absolute: 0 10*3/uL (ref 0.0–0.5)
Eosinophils Relative: 0 %
HCT: 42 % (ref 36.0–46.0)
Hemoglobin: 13.4 g/dL (ref 12.0–15.0)
Immature Granulocytes: 0 %
Lymphocytes Relative: 18 %
Lymphs Abs: 0.7 10*3/uL (ref 0.7–4.0)
MCH: 29.3 pg (ref 26.0–34.0)
MCHC: 31.9 g/dL (ref 30.0–36.0)
MCV: 91.9 fL (ref 80.0–100.0)
Monocytes Absolute: 0.4 10*3/uL (ref 0.1–1.0)
Monocytes Relative: 12 %
Neutro Abs: 2.6 10*3/uL (ref 1.7–7.7)
Neutrophils Relative %: 70 %
Platelets: 139 10*3/uL — ABNORMAL LOW (ref 150–400)
RBC: 4.57 MIL/uL (ref 3.87–5.11)
RDW: 13.7 % (ref 11.5–15.5)
WBC: 3.7 10*3/uL — ABNORMAL LOW (ref 4.0–10.5)
nRBC: 0 % (ref 0.0–0.2)

## 2023-02-05 LAB — COMPREHENSIVE METABOLIC PANEL
ALT: 16 U/L (ref 0–44)
AST: 30 U/L (ref 15–41)
Albumin: 3.7 g/dL (ref 3.5–5.0)
Alkaline Phosphatase: 49 U/L (ref 38–126)
Anion gap: 11 (ref 5–15)
BUN: 21 mg/dL (ref 8–23)
CO2: 25 mmol/L (ref 22–32)
Calcium: 8.4 mg/dL — ABNORMAL LOW (ref 8.9–10.3)
Chloride: 103 mmol/L (ref 98–111)
Creatinine, Ser: 1.02 mg/dL — ABNORMAL HIGH (ref 0.44–1.00)
GFR, Estimated: 54 mL/min — ABNORMAL LOW (ref >60)
Glucose, Bld: 196 mg/dL — ABNORMAL HIGH (ref 70–99)
Potassium: 3.3 mmol/L — ABNORMAL LOW (ref 3.5–5.1)
Sodium: 139 mmol/L (ref 135–145)
Total Bilirubin: 0.6 mg/dL (ref 0.0–1.2)
Total Protein: 6.3 g/dL — ABNORMAL LOW (ref 6.5–8.1)

## 2023-02-05 LAB — RESP PANEL BY RT-PCR (RSV, FLU A&B, COVID)  RVPGX2
Influenza A by PCR: POSITIVE — AB
Influenza B by PCR: NEGATIVE
Resp Syncytial Virus by PCR: NEGATIVE
SARS Coronavirus 2 by RT PCR: NEGATIVE

## 2023-02-05 LAB — LACTIC ACID, PLASMA
Lactic Acid, Venous: 1.9 mmol/L (ref 0.5–1.9)
Lactic Acid, Venous: 2.1 mmol/L (ref 0.5–1.9)

## 2023-02-05 MED ORDER — ALBUTEROL SULFATE (2.5 MG/3ML) 0.083% IN NEBU: 2.5000 mg | INHALATION_SOLUTION | RESPIRATORY_TRACT | Status: DC | PRN

## 2023-02-05 MED ORDER — IPRATROPIUM-ALBUTEROL 0.5-2.5 (3) MG/3ML IN SOLN
6.0000 mL | Freq: Once | RESPIRATORY_TRACT | Status: AC
  Administered 2023-02-05: 6 mL via RESPIRATORY_TRACT
  Filled 2023-02-05: qty 3

## 2023-02-05 MED ORDER — ENOXAPARIN SODIUM 30 MG/0.3ML IJ SOSY: 30.0000 mg | PREFILLED_SYRINGE | INTRAMUSCULAR | Status: DC

## 2023-02-05 MED ORDER — ONDANSETRON HCL 4 MG/2ML IJ SOLN: 4.0000 mg | Freq: Four times a day (QID) | INTRAMUSCULAR | Status: DC | PRN

## 2023-02-05 MED ORDER — PREDNISONE 20 MG PO TABS
40.0000 mg | ORAL_TABLET | Freq: Every day | ORAL | Status: DC
  Administered 2023-02-06: 40 mg via ORAL
  Filled 2023-02-05: qty 2

## 2023-02-05 MED ORDER — OLANZAPINE 10 MG IM SOLR
2.5000 mg | Freq: Once | INTRAMUSCULAR | Status: DC | PRN
  Filled 2023-02-05 (×2): qty 10

## 2023-02-05 MED ORDER — INSULIN ASPART 100 UNIT/ML IJ SOLN
3.0000 [IU] | Freq: Three times a day (TID) | INTRAMUSCULAR | Status: DC
Start: 2023-02-06 — End: 2023-02-06
  Filled 2023-02-05: qty 1

## 2023-02-05 MED ORDER — INSULIN ASPART 100 UNIT/ML IJ SOLN
0.0000 [IU] | Freq: Three times a day (TID) | INTRAMUSCULAR | Status: DC
  Administered 2023-02-06: 1 [IU] via SUBCUTANEOUS

## 2023-02-05 MED ORDER — SODIUM CHLORIDE 0.9 % IV BOLUS
1000.0000 mL | Freq: Once | INTRAVENOUS | Status: AC
  Administered 2023-02-05: 1000 mL via INTRAVENOUS

## 2023-02-05 MED ORDER — IPRATROPIUM-ALBUTEROL 0.5-2.5 (3) MG/3ML IN SOLN
3.0000 mL | Freq: Four times a day (QID) | RESPIRATORY_TRACT | Status: DC
  Administered 2023-02-05 – 2023-02-06 (×4): 3 mL via RESPIRATORY_TRACT
  Filled 2023-02-05 (×4): qty 3

## 2023-02-05 MED ORDER — SODIUM CHLORIDE 0.9 % IV SOLN: INTRAVENOUS | Status: AC

## 2023-02-05 MED ORDER — METHYLPREDNISOLONE SODIUM SUCC 125 MG IJ SOLR
125.0000 mg | Freq: Once | INTRAMUSCULAR | Status: AC
  Administered 2023-02-05: 125 mg via INTRAVENOUS
  Filled 2023-02-05: qty 2

## 2023-02-05 MED ORDER — OSELTAMIVIR PHOSPHATE 30 MG PO CAPS
30.0000 mg | ORAL_CAPSULE | Freq: Once | ORAL | Status: DC
  Filled 2023-02-05: qty 1

## 2023-02-05 MED ORDER — OSELTAMIVIR PHOSPHATE 30 MG PO CAPS
30.0000 mg | ORAL_CAPSULE | Freq: Every day | ORAL | Status: DC
  Administered 2023-02-05: 30 mg via ORAL
  Filled 2023-02-05 (×2): qty 1

## 2023-02-05 MED ORDER — POTASSIUM CHLORIDE CRYS ER 20 MEQ PO TBCR
20.0000 meq | EXTENDED_RELEASE_TABLET | Freq: Once | ORAL | Status: AC
  Administered 2023-02-05: 20 meq via ORAL
  Filled 2023-02-05: qty 1

## 2023-02-05 MED ORDER — ONDANSETRON HCL 4 MG PO TABS: 4.0000 mg | ORAL_TABLET | Freq: Four times a day (QID) | ORAL | Status: DC | PRN

## 2023-02-05 NOTE — Assessment & Plan Note (Signed)
Blood sugar 190s  SSI  A1C  Monitor w/ steroid use.

## 2023-02-05 NOTE — ED Provider Triage Note (Signed)
Emergency Medicine Provider Triage Evaluation Note  Jeanne Stewart , a 83 y.o. female  was evaluated in triage.  Pt complains of congestion per patient. Son brought patient here because she thought she had pneumonia.  Productive cough yesterday.    Review of Systems  Positive: + cough, congestion Negative: No N,V,D.  Physical Exam  There were no vitals taken for this visit. Gen:   Awake, no distress  talkative Resp:  Normal effort  Lungs with mild rales, occ. Faint wheeze MSK:   Moves extremities without difficulty  Other:    Medical Decision Making  Medically screening exam initiated at 10:42 AM.  Appropriate orders placed.  Jeanne Stewart was informed that the remainder of the evaluation will be completed by another provider, this initial triage assessment does not replace that evaluation, and the importance of remaining in the ED until their evaluation is complete.     Tommi Rumps, PA-C 02/05/23 1044

## 2023-02-05 NOTE — Assessment & Plan Note (Addendum)
Influenza A  Asthma Exacerbation  Decompensated resp failure requiring 2 L nasal cannula in the setting of influenza A and asthma exacerbation Chest x-ray within normal limits IV Solu-Medrol Renally dosed Tamiflu As needed DuoNebs Continue supplemental oxygen Monitor closely

## 2023-02-05 NOTE — ED Triage Notes (Signed)
Cold s/s for 3 days, pt's daughter was concerned that she has pneumonia, pt denies any complaints, pt's son states that she coughed up some congestion yesterday, pt seems confused, o2 sat on room air 88-90%

## 2023-02-05 NOTE — ED Provider Notes (Signed)
[facility] Provider Note    Event Date/Time   First MD Initiated Contact with Patient 02/05/23 1312     (approximate)   History   Cough and Nasal Congestion   HPI  Jeanne Stewart is a 83 y.o. female past medical history significant for asthma, who presents to the emergency department with shortness of breath and cough.  Patient endorses 1 day history of cough and congestion.  States that she has not been feeling well.  History is provided by the patient's son at bedside who states that she has been coughing significantly.  Did have a fall yesterday but denies any head injury.  Denies any pain from the fall.  Denies any chest pain.  States that she has a history of needing inhalers over the winter months.  Denies any tobacco history.     Physical Exam   Triage Vital Signs: ED Triage Vitals [02/05/23 1043]  Encounter Vitals Group     BP 110/60     Systolic BP Percentile      Diastolic BP Percentile      Pulse Rate 80     Resp 16     Temp 98.5 F (36.9 C)     Temp Source Oral     SpO2 (!) 88 %     Weight 115 lb (52.2 kg)     Height 4\' 10"  (1.473 m)     Head Circumference      Peak Flow      Pain Score 0     Pain Loc      Pain Education      Exclude from Growth Chart     Most recent vital signs: Vitals:   02/05/23 1043  BP: 110/60  Pulse: 80  Resp: 16  Temp: 98.5 F (36.9 C)  SpO2: (!) 88%    Physical Exam Constitutional:      Appearance: She is well-developed.  HENT:     Head: Atraumatic.  Eyes:     Conjunctiva/sclera: Conjunctivae normal.  Cardiovascular:     Rate and Rhythm: Regular rhythm.  Pulmonary:     Effort: Respiratory distress present.     Comments: Rhonchi with wheezing to lower lung fields.  85% on room air.  Placed on 2 L nasal cannula. Abdominal:     General: There is no distension.  Musculoskeletal:        General: Normal range of motion.     Cervical back: Normal range of motion.  Skin:    General: Skin  is warm.     Capillary Refill: Capillary refill takes less than 2 seconds.  Neurological:     Mental Status: She is alert. Mental status is at baseline.  Psychiatric:        Mood and Affect: Mood normal.     IMPRESSION / MDM / ASSESSMENT AND PLAN / ED COURSE  I reviewed the triage vital signs and the nursing notes.  Differential diagnosis including viral illness including COVID/influenza, pneumonia, anemia, asthma exacerbation  .  RADIOLOGY I independently reviewed imaging, my interpretation of imaging: Chest x-ray with no signs of pneumonia.  Read as no acute findings.  LABS (all labs ordered are listed, but only abnormal results are displayed) Labs interpreted as -    Labs Reviewed  RESP PANEL BY RT-PCR (RSV, FLU A&B, COVID)  RVPGX2 - Abnormal; Notable for the following components:      Result Value   Influenza A by PCR POSITIVE (*)  All other components within normal limits  COMPREHENSIVE METABOLIC PANEL - Abnormal; Notable for the following components:   Potassium 3.3 (*)    Glucose, Bld 196 (*)    Creatinine, Ser 1.02 (*)    Calcium 8.4 (*)    Total Protein 6.3 (*)    GFR, Estimated 54 (*)    All other components within normal limits  CBC WITH DIFFERENTIAL/PLATELET - Abnormal; Notable for the following components:   WBC 3.7 (*)    Platelets 139 (*)    All other components within normal limits  LACTIC ACID, PLASMA  LACTIC ACID, PLASMA     MDM    Patient with acute hypoxic respiratory failure with 85% on room air.  Placed on 2 L nasal cannula.  Influenza A is positive.  Chest x-ray with no focal findings consistent with pneumonia.  Does have significant wheezing on lung exam.  Patient given DuoNeb treatment and IV Solu-Medrol to treat her asthma exacerbation likely in the setting of influenza A.  Given renally dosed Tamiflu.  Consulted hospitalist for acute hypoxic respiratory failure in the setting of asthma exacerbation and  influenza   PROCEDURES:  Critical Care performed: No  Procedures  Patient's presentation is most consistent with acute presentation with potential threat to life or bodily function.   MEDICATIONS ORDERED IN ED: Medications  oseltamivir (TAMIFLU) capsule 30 mg (has no administration in time range)  methylPREDNISolone sodium succinate (SOLU-MEDROL) 125 mg/2 mL injection 125 mg (has no administration in time range)  enoxaparin (LOVENOX) injection 30 mg (has no administration in time range)  0.9 %  sodium chloride infusion (has no administration in time range)  ondansetron (ZOFRAN) tablet 4 mg (has no administration in time range)    Or  ondansetron (ZOFRAN) injection 4 mg (has no administration in time range)  predniSONE (DELTASONE) tablet 40 mg (has no administration in time range)  albuterol (PROVENTIL) (2.5 MG/3ML) 0.083% nebulizer solution 2.5 mg (has no administration in time range)  ipratropium-albuterol (DUONEB) 0.5-2.5 (3) MG/3ML nebulizer solution 3 mL (has no administration in time range)  ipratropium-albuterol (DUONEB) 0.5-2.5 (3) MG/3ML nebulizer solution 6 mL (6 mLs Nebulization Given 02/05/23 1400)  sodium chloride 0.9 % bolus 1,000 mL (1,000 mLs Intravenous New Bag/Given 02/05/23 1403)  potassium chloride SA (KLOR-CON M) CR tablet 20 mEq (20 mEq Oral Given 02/05/23 1359)    FINAL CLINICAL IMPRESSION(S) / ED DIAGNOSES   Final diagnoses:  Hypoxic  Influenza A     Rx / DC Orders   ED Discharge Orders     None        Note:  This document was prepared using Dragon voice recognition software and may include unintentional dictation errors.   Corena Herter, MD 02/05/23 [phone]

## 2023-02-05 NOTE — Assessment & Plan Note (Signed)
BP stable Titrate home regimen

## 2023-02-05 NOTE — H&P (Signed)
History and Physical    Patient: Jeanne Stewart ZOX:[id number] DOB: 17-Jul-1940 DOA: 02/05/2023 DOS: the patient was seen and examined on 02/05/2023 PCP: [name], MD  Patient coming from: Home  Chief Complaint:  Chief Complaint  Patient presents with   Cough   Nasal Congestion   HPI: Jeanne Stewart is a 83 y.o. female with medical history significant of asthma, hypertension, hyperlipidemia, dementia presenting with acute respiratory failure with hypoxia, influenza A, asthma exacerbation.  Family reports generalized malaise and cough for approximately 3 to 4 days.  Positive fatigue.  Had worsening symptoms that decompensated over the past 1 to 2 days.  Positive cough and wheezing.  Using home inhalers with minimal improvement in symptoms.  No reported chest pain, abdominal pain, nausea or vomiting. Presented to the ER afebrile, hemodynamically stable.  Satting in mid 80s on room air.  Transition to 2 L nasal cannula to keep O2 sats greater than 95%.  White count 3.7, hemoglobin 13.4, platelets 139, creatinine 1.02, glucose 190, influenza A positive.  Chest x-ray stable. Review of Systems: As mentioned in the history of present illness. All other systems reviewed and are negative. Past Medical History:  Diagnosis Date   Asthma    Hyperlipidemia    Hypertension    Memory changes    Wears dentures    full upper and lower   Past Surgical History:  Procedure Laterality Date   ANKLE FRACTURE SURGERY Right    CATARACT EXTRACTION W/PHACO Right 08/23/2019   Procedure: CATARACT EXTRACTION PHACO AND INTRAOCULAR LENS PLACEMENT (IOC) RIGHT 17.09 01:20.3;  Surgeon: Galen Manila, MD;  Location: [facility];  Service: Ophthalmology;  Laterality: Right;   CATARACT EXTRACTION W/PHACO Left 09/27/2019   Procedure: CATARACT EXTRACTION PHACO AND INTRAOCULAR LENS PLACEMENT (IOC) LEFT DIABETIC 14.68 01:13.2;  Surgeon: Galen Manila, MD;  Location: [facility];  Service:  Ophthalmology;  Laterality: Left;   GASTRIC BYPASS     Social History:  reports that she has never smoked. She has never used smokeless tobacco. She reports that she does not drink alcohol. No history on file for drug use.  No Known Allergies  No family history on file.  Prior to Admission medications   Medication Sig Start Date End Date Taking? Authorizing Provider  albuterol (PROVENTIL HFA;VENTOLIN HFA) 108 (90 Base) MCG/ACT inhaler Inhale 2 puffs into the lungs every 6 (six) hours as needed for wheezing or shortness of breath. 10/24/16   Minna Antis, MD  CALCIUM PO Take by mouth daily.    [provider]  COMBIVENT RESPIMAT 20-100 MCG/ACT AERS respimat Inhale 1 puff into the lungs every 6 (six) hours as needed for wheezing or shortness of breath. 10/24/16   Minna Antis, MD  donepezil (ARICEPT) 5 MG tablet Take 5 mg by mouth at bedtime. Patient not taking: Reported on 08/23/2019    [provider]  lisinopril-hydrochlorothiazide (PRINZIDE,ZESTORETIC) 20-25 MG tablet Take 1 tablet by mouth daily. Patient not taking: Reported on 08/23/2019 12/22/14   [provider]  ondansetron (ZOFRAN-ODT) 4 MG disintegrating tablet Take 1 tablet (4 mg total) by mouth every 8 (eight) hours as needed for nausea or vomiting. 08/01/22   Shaune Pollack, MD  potassium chloride SA (KLOR-CON M) 20 MEQ tablet Take 2 tablets (40 mEq total) by mouth daily for 3 days. 08/01/22 08/04/22  Shaune Pollack, MD  rosuvastatin (CRESTOR) 10 MG tablet Take 10 mg by mouth daily. Patient not taking: Reported on 08/23/2019 09/29/16   [provider]  VITAMIN D PO Take by mouth daily.    [provider]    Physical Exam: Vitals:   02/05/23 1043  BP: 110/60  Pulse: 80  Resp: 16  Temp: 98.5 F (36.9 C)  TempSrc: Oral  SpO2: (!) 88%  Weight: 52.2 kg  Height: 4\' 10"  (1.473 m)   Physical Exam Constitutional:      Appearance: She is normal weight.  HENT:     Head:  Normocephalic and atraumatic.     Nose: Nose normal.     Mouth/Throat:     Mouth: Mucous membranes are moist.  Eyes:     Pupils: Pupils are equal, round, and reactive to light.  Cardiovascular:     Rate and Rhythm: Normal rate and regular rhythm.  Pulmonary:     Effort: Pulmonary effort is normal.     Breath sounds: Wheezing present.  Abdominal:     General: Bowel sounds are normal.  Musculoskeletal:        General: Normal range of motion.  Skin:    General: Skin is warm.  Neurological:     General: No focal deficit present.  Psychiatric:        Mood and Affect: Mood normal.     Data Reviewed:  There are no new results to review at this time.  DG Chest 2 View CLINICAL DATA:  Cough.  EXAM: CHEST - 2 VIEW  COMPARISON:  07/31/2022.  FINDINGS: Bilateral lung fields are clear. Bilateral costophrenic angles are clear.  Normal cardio-mediastinal silhouette.  Moderate size retrocardiac hiatal hernia noted.  No acute osseous abnormalities.  The soft tissues are within normal limits.  IMPRESSION: No active cardiopulmonary disease.  Electronically Signed   By: Jules Schick M.D.   On: 02/05/2023 11:51  Lab Results  Component Value Date   WBC 3.7 (L) 02/05/2023   HGB 13.4 02/05/2023   HCT 42.0 02/05/2023   MCV 91.9 02/05/2023   PLT 139 (L) 02/05/2023   Last metabolic panel Lab Results  Component Value Date   GLUCOSE 196 (H) 02/05/2023   NA 139 02/05/2023   K 3.3 (L) 02/05/2023   CL 103 02/05/2023   CO2 25 02/05/2023   BUN 21 02/05/2023   CREATININE 1.02 (H) 02/05/2023   GFRNONAA 54 (L) 02/05/2023   CALCIUM 8.4 (L) 02/05/2023   PROT 6.3 (L) 02/05/2023   ALBUMIN 3.7 02/05/2023   BILITOT 0.6 02/05/2023   ALKPHOS 49 02/05/2023   AST 30 02/05/2023   ALT 16 02/05/2023   ANIONGAP 11 02/05/2023    Assessment and Plan: * Acute respiratory failure with hypoxia (HCC) Influenza A  Asthma Exacerbation  Decompensated resp failure requiring 2 L nasal  cannula in the setting of influenza A and asthma exacerbation Chest x-ray within normal limits IV Solu-Medrol Renally dosed Tamiflu As needed DuoNebs Continue supplemental oxygen Monitor closely  Essential hypertension BP stable  Titrate home regimen        Advance Care Planning:   Code Status: Full Code   Consults: None   Family Communication: Family at the bedside   Severity of Illness: The appropriate patient status for this patient is INPATIENT. Inpatient status is judged to be reasonable and necessary in order to provide the required intensity of service to ensure the patient's safety. The patient's presenting symptoms, physical exam findings, and initial radiographic and laboratory data in the context of their chronic comorbidities is felt to place them at high risk for further clinical deterioration. Furthermore, it is not anticipated that  the patient will be medically stable for discharge from the hospital within 2 midnights of admission.   * I certify that at the point of admission it is my clinical judgment that the patient will require inpatient hospital care spanning beyond 2 midnights from the point of admission due to high intensity of service, high risk for further deterioration and high frequency of surveillance required.*  Author: Floydene Flock, MD 02/05/2023 3:01 PM  For on call review [email or web address].

## 2023-02-06 DIAGNOSIS — Z66 Do not resuscitate: Secondary | ICD-10-CM | POA: Diagnosis not present

## 2023-02-06 DIAGNOSIS — F03918 Unspecified dementia, unspecified severity, with other behavioral disturbance: Secondary | ICD-10-CM | POA: Diagnosis present

## 2023-02-06 DIAGNOSIS — E785 Hyperlipidemia, unspecified: Secondary | ICD-10-CM | POA: Diagnosis present

## 2023-02-06 DIAGNOSIS — J441 Chronic obstructive pulmonary disease with (acute) exacerbation: Secondary | ICD-10-CM | POA: Diagnosis present

## 2023-02-06 DIAGNOSIS — J101 Influenza due to other identified influenza virus with other respiratory manifestations: Secondary | ICD-10-CM | POA: Diagnosis not present

## 2023-02-06 LAB — CBC
HCT: 35.8 % — ABNORMAL LOW (ref 36.0–46.0)
Hemoglobin: 11.7 g/dL — ABNORMAL LOW (ref 12.0–15.0)
MCH: 29.2 pg (ref 26.0–34.0)
MCHC: 32.7 g/dL (ref 30.0–36.0)
MCV: 89.3 fL (ref 80.0–100.0)
Platelets: 112 10*3/uL — ABNORMAL LOW (ref 150–400)
RBC: 4.01 MIL/uL (ref 3.87–5.11)
RDW: 13.7 % (ref 11.5–15.5)
WBC: 2.7 10*3/uL — ABNORMAL LOW (ref 4.0–10.5)
nRBC: 0 % (ref 0.0–0.2)

## 2023-02-06 LAB — COMPREHENSIVE METABOLIC PANEL
ALT: 14 U/L (ref 0–44)
AST: 24 U/L (ref 15–41)
Albumin: 3 g/dL — ABNORMAL LOW (ref 3.5–5.0)
Alkaline Phosphatase: 44 U/L (ref 38–126)
Anion gap: 9 (ref 5–15)
BUN: 15 mg/dL (ref 8–23)
CO2: 27 mmol/L (ref 22–32)
Calcium: 8.2 mg/dL — ABNORMAL LOW (ref 8.9–10.3)
Chloride: 107 mmol/L (ref 98–111)
Creatinine, Ser: 0.76 mg/dL (ref 0.44–1.00)
GFR, Estimated: >60 mL/min (ref >60)
Glucose, Bld: 165 mg/dL — ABNORMAL HIGH (ref 70–99)
Potassium: 3.8 mmol/L (ref 3.5–5.1)
Sodium: 143 mmol/L (ref 135–145)
Total Bilirubin: 0.4 mg/dL (ref 0.0–1.2)
Total Protein: 5.8 g/dL — ABNORMAL LOW (ref 6.5–8.1)

## 2023-02-06 LAB — GLUCOSE, CAPILLARY
Glucose-Capillary: 139 mg/dL — ABNORMAL HIGH (ref 70–99)
Glucose-Capillary: 158 mg/dL — ABNORMAL HIGH (ref 70–99)

## 2023-02-06 LAB — HEMOGLOBIN A1C
Hgb A1c MFr Bld: 5.7 % — ABNORMAL HIGH (ref 4.8–5.6)
Mean Plasma Glucose: 116.89 mg/dL

## 2023-02-06 MED ORDER — OSELTAMIVIR PHOSPHATE 30 MG PO CAPS: 30.0000 mg | ORAL_CAPSULE | Freq: Two times a day (BID) | ORAL | 0 refills | Status: AC

## 2023-02-06 MED ORDER — ALBUTEROL SULFATE HFA 108 (90 BASE) MCG/ACT IN AERS
2.0000 | INHALATION_SPRAY | Freq: Four times a day (QID) | RESPIRATORY_TRACT | 2 refills | Status: AC | PRN
Start: 2023-02-06 — End: ?

## 2023-02-06 MED ORDER — FLUTICASONE FUROATE-VILANTEROL 100-25 MCG/ACT IN AEPB
1.0000 | INHALATION_SPRAY | Freq: Every day | RESPIRATORY_TRACT | Status: DC
  Filled 2023-02-06: qty 28

## 2023-02-06 MED ORDER — ENOXAPARIN SODIUM 40 MG/0.4ML IJ SOSY
40.0000 mg | PREFILLED_SYRINGE | INTRAMUSCULAR | Status: DC
Start: 2023-02-06 — End: 2023-02-06

## 2023-02-06 MED ORDER — ROSUVASTATIN CALCIUM 10 MG PO TABS
10.0000 mg | ORAL_TABLET | Freq: Every day | ORAL | Status: DC
  Administered 2023-02-06: 10 mg via ORAL
  Filled 2023-02-06: qty 1

## 2023-02-06 MED ORDER — OSELTAMIVIR PHOSPHATE 30 MG PO CAPS
30.0000 mg | ORAL_CAPSULE | Freq: Two times a day (BID) | ORAL | Status: DC
  Administered 2023-02-06: 30 mg via ORAL
  Filled 2023-02-06 (×2): qty 1

## 2023-02-06 MED ORDER — DONEPEZIL HCL 5 MG PO TABS: 5.0000 mg | ORAL_TABLET | Freq: Every day | ORAL | Status: DC

## 2023-02-06 MED ORDER — BUPROPION HCL ER (XL) 150 MG PO TB24
150.0000 mg | ORAL_TABLET | Freq: Every day | ORAL | Status: DC
  Administered 2023-02-06: 150 mg via ORAL
  Filled 2023-02-06: qty 1

## 2023-02-06 MED ORDER — LISINOPRIL 20 MG PO TABS
20.0000 mg | ORAL_TABLET | Freq: Every day | ORAL | Status: DC
  Administered 2023-02-06: 20 mg via ORAL
  Filled 2023-02-06: qty 1

## 2023-02-06 MED ORDER — PREDNISONE 20 MG PO TABS: 40.0000 mg | ORAL_TABLET | Freq: Every day | ORAL | 0 refills | Status: AC

## 2023-02-06 NOTE — Progress Notes (Signed)
Anticoagulation monitoring(Lovenox):  82yo  F ordered Lovenox 30 mg Q24h    Filed Weights   02/05/23 1043  Weight: 52.2 kg (115 lb)   BMI 24   Lab Results  Component Value Date   CREATININE 0.76 02/06/2023   CREATININE 1.02 (H) 02/05/2023   CREATININE 0.80 09/21/2022   Estimated Creatinine Clearance: 37.5 mL/min (by C-G formula based on SCr of 0.76 mg/dL). Hemoglobin & Hematocrit     Component Value Date/Time   HGB 11.7 (L) 02/06/2023 0507   HCT 35.8 (L) 02/06/2023 0507     Per Protocol for Patient with estCrcl > 30 ml/min and BMI < 30, will transition to Lovenox 40 mg Q24h      Bari Mantis PharmD Clinical Pharmacist 02/06/2023

## 2023-02-06 NOTE — Hospital Course (Signed)
82yo with h/o HTN, HLD, asthma, and dementia who presented on 1/30 with malaise and cough x 3-4 days.  She was noted to be hypoxic and was diagnosed with influenza A.  She remains on 2-3L [place] O2.  She had significant agitation with sundowning on the night of admission, requiring a sitter.

## 2023-02-06 NOTE — Progress Notes (Signed)
PHARMACY NOTE:  ANTIMICROBIAL RENAL DOSAGE ADJUSTMENT  Current antimicrobial regimen includes a mismatch between antimicrobial dosage and estimated renal function.  As per policy approved by the Pharmacy & Therapeutics and Medical Executive Committees, the antimicrobial dosage will be adjusted accordingly.  Current antimicrobial dosage:  oseltamivir 30mg  daily  Indication: influenza  Renal Function:  Estimated Creatinine Clearance: 37.5 mL/min (by C-G formula based on SCr of 0.76 mg/dL). []      On intermittent HD, scheduled: []      On CRRT    Antimicrobial dosage has been changed to:  oseltamivir 30mg  BID  Additional comments:   Thank you for allowing pharmacy to be a part of this patient's care.  Juliette Alcide, PharmD, BCPS, BCIDP Work Cell: [phone] 02/06/2023 8:39 AM

## 2023-02-06 NOTE — Plan of Care (Signed)
  Problem: Nutrition: Goal: Adequate nutrition will be maintained Outcome: Progressing   Problem: Safety: Goal: Ability to remain free from injury will improve Outcome: Progressing   

## 2023-02-06 NOTE — Progress Notes (Signed)
       CROSS COVER NOTE  NAME: Jeanne Stewart MRN: [id number] DOB : 04/10/1940 ATTENDING PHYSICIAN: Floydene Flock, MD    Date of Service   02/06/2023   HPI/Events of Note   Message via secure chat pt came into the ED yesterday with shortness of breath and tested positive for influenza. pt has PMH of dementia/alzheimer's disease and is currently confused, agitated and is refusing to stay in bed, pt has a high fall risk can we have a sitter order?   Interventions   Assessment/Plan: 1:! Sitter Delirium precautions        Donnie Mesa NP [facility] Cross Cover 7pm-7am - check amion for availability Pager [phone]

## 2023-02-06 NOTE — Discharge Summary (Signed)
Physician Discharge Summary   Patient: Jeanne Stewart MRN: [id number] DOB: 25-Nov-1940  Admit date:     02/05/2023  Discharge date: 02/06/23  Discharge Physician: Jonah Blue   PCP: [name], MD   Recommendations at discharge:   Continue Tamiflu to complete 5 days; may stop if no longer needed or if side effects develop Continue steroids (prednisone) daily for 5 total days Albuterol inhaler was also prescribed Out of facility DNR form provided, as per discussion with patient Follow up with Dr. Dario Guardian in 1-2 weeks  Discharge Diagnoses: Principal Problem:   Influenza A Active Problems:   Essential hypertension   Hyperglycemia   Asthma, chronic obstructive, with acute exacerbation (HCC)   Dementia with behavioral disturbance (HCC)   Dyslipidemia   DNR (do not resuscitate)    Hospital Course: 82yo with h/o HTN, HLD, asthma, and dementia who presented on 1/30 with malaise and cough x 3-4 days.  She was noted to be hypoxic and was diagnosed with influenza A.  She remains on 2-3L [place] O2.  She had significant agitation with sundowning on the night of admission, requiring a sitter.  Assessment and Plan:  Influenza A  Decompensated resp failure requiring 2 L nasal cannula in the setting of influenza A and asthma exacerbation Chest x-ray within normal limits Renally dosed Tamiflu - risks/benefits discussed with daughter, can stop if no longer needed or if unable to tolerate She is now comfortable on room air without symptoms and would like to go home  Asthma Exacerbation  IV Solu-Medrol -> prednisone As needed DuoNebs No longer requiring supplemental O2   Essential hypertension BP stable  Continue lisinopril  Dementia with behavioral disturbance Significant sundowning overnight requiring a sitter Will do better at home and patient/family prefer to be there  Continue bupropion, donepezil  HLD Continue rosuvastatin  Hyperglycemia Elevated glucose, likely acute  phase reaction A1c 5.7 She is not diabetic at this time and is unlikely to benefit from ongoing screening  DNR Confirmed with patient/daughter Will provide gold out-of-facility DNR form        Consultants: None   Procedures: None   Antibiotics: Tamiflu     Pain control - [place] Controlled Substance Reporting System database was reviewed. and patient was instructed, not to drive, operate heavy machinery, perform activities at heights, swimming or participation in water activities or provide baby-sitting services while on Pain, Sleep and Anxiety Medications; until their outpatient Physician has advised to do so again. Also recommended to not to take more than prescribed Pain, Sleep and Anxiety Medications.    Disposition: Home Diet recommendation:  Regular diet DISCHARGE MEDICATION: Allergies as of 02/06/2023   No Known Allergies      Medication List     TAKE these medications    albuterol 108 (90 Base) MCG/ACT inhaler Commonly known as: VENTOLIN HFA Inhale 2 puffs into the lungs every 6 (six) hours as needed for wheezing or shortness of breath.   Breo Ellipta 100-25 MCG/ACT Aepb Generic drug: fluticasone furoate-vilanterol Inhale 1 puff into the lungs daily.   buPROPion 150 MG 24 hr tablet Commonly known as: WELLBUTRIN XL Take 150 mg by mouth daily.   CALCIUM PO Take 1 capsule by mouth daily.   donepezil 5 MG tablet Commonly known as: ARICEPT Take 5 mg by mouth at bedtime.   lisinopril 20 MG tablet Commonly known as: ZESTRIL Take 20 mg by mouth daily.   oseltamivir 30 MG capsule Commonly known as: TAMIFLU Take 1 capsule (30 mg  total) by mouth 2 (two) times daily for 9 doses.   predniSONE 20 MG tablet Commonly known as: DELTASONE Take 2 tablets (40 mg total) by mouth daily with breakfast for 4 days. Start taking on: February 07, 2023   rosuvastatin 10 MG tablet Commonly known as: CRESTOR Take 10 mg by mouth daily.   VITAMIN D PO Take 1  capsule by mouth daily.        Discharge Exam:    Subjective: Feeling well, no complaints, comfortable on RA, wants to go home.  Daughter at bedside and agrees.   Objective: Vitals:   02/06/23 0407 02/06/23 0742  BP: 122/67 (!) 145/65  Pulse: 62 (!) 57  Resp: 20 18  Temp: (!) 97.2 F (36.2 C) 97.7 F (36.5 C)  SpO2: 95% 97%    Intake/Output Summary (Last 24 hours) at 02/06/2023 1329 Last data filed at 02/06/2023 0930 Gross per 24 hour  Intake 505 ml  Output --  Net 505 ml   Filed Weights   02/05/23 1043  Weight: 52.2 kg    Exam:  General:  Appears calm and comfortable and is in NAD, on RA Eyes:  EOMI, normal lids, iris ENT:  grossly normal hearing, lips & tongue, mmm Neck:  no LAD, masses or thyromegaly Cardiovascular:  RRR, no m/r/g. No LE edema.  Respiratory:   CTA bilaterally with no wheezes/rales/rhonchi.  Normal respiratory effort. Abdomen:  soft, NT, ND Skin:  no rash or induration seen on limited exam Musculoskeletal:  grossly normal tone BUE/BLE, good ROM, no bony abnormality Psychiatric:  pleasant mood and affect, speech appropriate Neurologic:  CN 2-12 grossly intact, moves all extremities in coordinated fashion  Data Reviewed: I have reviewed the patient's lab results since admission.  Pertinent labs for today include:  Glucose 165 Albumin 3 Lactate 1.9, 2.1 WBC 2.7 Hgb 11.7 Platelets 112      Condition at discharge: stable  The results of significant diagnostics from this hospitalization (including imaging, microbiology, ancillary and laboratory) are listed below for reference.   Imaging Studies: DG Chest 2 View Result Date: 02/05/2023 CLINICAL DATA:  Cough. EXAM: CHEST - 2 VIEW COMPARISON:  07/31/2022. FINDINGS: Bilateral lung fields are clear. Bilateral costophrenic angles are clear. Normal cardio-mediastinal silhouette. Moderate size retrocardiac hiatal hernia noted. No acute osseous abnormalities. The soft tissues are within normal  limits. IMPRESSION: No active cardiopulmonary disease. Electronically Signed   By: Jules Schick M.D.   On: 02/05/2023 11:51    Microbiology: Results for orders placed or performed during the hospital encounter of 02/05/23  Resp panel by RT-PCR (RSV, Flu A&B, Covid) Anterior Nasal Swab     Status: Abnormal   Collection Time: 02/05/23 10:48 AM   Specimen: Anterior Nasal Swab  Result Value Ref Range Status   SARS Coronavirus 2 by RT PCR NEGATIVE NEGATIVE Final    Comment: (NOTE) SARS-CoV-2 target nucleic acids are NOT DETECTED.  The SARS-CoV-2 RNA is generally detectable in upper respiratory specimens during the acute phase of infection. The lowest concentration of SARS-CoV-2 viral copies this assay can detect is 138 copies/mL. A negative result does not preclude SARS-Cov-2 infection and should not be used as the sole basis for treatment or other patient management decisions. A negative result may occur with  improper specimen collection/handling, submission of specimen other than nasopharyngeal swab, presence of viral mutation(s) within the areas targeted by this assay, and inadequate number of viral copies(<138 copies/mL). A negative result must be combined with clinical observations, patient history, and  epidemiological information. The expected result is Negative.  Fact Sheet for Patients:  BloggerCourse.com  Fact Sheet for Healthcare Providers:  SeriousBroker.it  This test is no t yet approved or cleared by the [place] FDA and  has been authorized for detection and/or diagnosis of SARS-CoV-2 by FDA under an Emergency Use Authorization (EUA). This EUA will remain  in effect (meaning this test can be used) for the duration of the COVID-19 declaration under Section 564(b)(1) of the Act, 21 U.S.C.section 360bbb-3(b)(1), unless the authorization is terminated  or revoked sooner.       Influenza A by PCR POSITIVE (A)  NEGATIVE Final   Influenza B by PCR NEGATIVE NEGATIVE Final    Comment: (NOTE) The Xpert Xpress SARS-CoV-2/FLU/RSV plus assay is intended as an aid in the diagnosis of influenza from Nasopharyngeal swab specimens and should not be used as a sole basis for treatment. Nasal washings and aspirates are unacceptable for Xpert Xpress SARS-CoV-2/FLU/RSV testing.  Fact Sheet for Patients: BloggerCourse.com  Fact Sheet for Healthcare Providers: SeriousBroker.it  This test is not yet approved or cleared by the [place] FDA and has been authorized for detection and/or diagnosis of SARS-CoV-2 by FDA under an Emergency Use Authorization (EUA). This EUA will remain in effect (meaning this test can be used) for the duration of the COVID-19 declaration under Section 564(b)(1) of the Act, 21 U.S.C. section 360bbb-3(b)(1), unless the authorization is terminated or revoked.     Resp Syncytial Virus by PCR NEGATIVE NEGATIVE Final    Comment: (NOTE) Fact Sheet for Patients: BloggerCourse.com  Fact Sheet for Healthcare Providers: SeriousBroker.it  This test is not yet approved or cleared by the [place] FDA and has been authorized for detection and/or diagnosis of SARS-CoV-2 by FDA under an Emergency Use Authorization (EUA). This EUA will remain in effect (meaning this test can be used) for the duration of the COVID-19 declaration under Section 564(b)(1) of the Act, 21 U.S.C. section 360bbb-3(b)(1), unless the authorization is terminated or revoked.  Performed at [facility], [address] [place], [place] [address]     Labs: CBC: Recent Labs  Lab 02/05/23 1048 02/06/23 0507  WBC 3.7* 2.7*  NEUTROABS 2.6  --   HGB 13.4 11.7*  HCT 42.0 35.8*  MCV 91.9 89.3  PLT 139* 112*   Basic Metabolic Panel: Recent Labs  Lab 02/05/23 1048 02/06/23 0507  NA 139 143  K  3.3* 3.8  CL 103 107  CO2 25 27  GLUCOSE 196* 165*  BUN 21 15  CREATININE 1.02* 0.76  CALCIUM 8.4* 8.2*   Liver Function Tests: Recent Labs  Lab 02/05/23 1048 02/06/23 0507  AST 30 24  ALT 16 14  ALKPHOS 49 44  BILITOT 0.6 0.4  PROT 6.3* 5.8*  ALBUMIN 3.7 3.0*   CBG: Recent Labs  Lab 02/06/23 0746 02/06/23 1142  GLUCAP 139* 158*    Discharge time spent: greater than 30 minutes.  Signed: Jonah Blue, MD [facility] 02/06/2023

## 2023-02-06 NOTE — Care Management Obs Status (Signed)
MEDICARE OBSERVATION STATUS NOTIFICATION   Patient Details  Name: Jeanne Stewart MRN: [id number] Date of Birth: 06-26-1940   Medicare Observation Status Notification Given:  Yes    Lorri Frederick, LCSW 02/06/2023, 2:38 PM

## 2023-02-06 NOTE — Plan of Care (Signed)

## 2023-02-06 NOTE — Care Management CC44 (Signed)
Condition Code 44 Documentation Completed  Patient Details  Name: Jeanne Stewart MRN: [id number] Date of Birth: [birth year]   Condition Code 44 given:  Yes Patient signature on Condition Code 44 notice:  Yes Documentation of 2 MD's agreement:  Yes Code 44 added to claim:  Yes    Lorri Frederick, LCSW 02/06/2023, 2:38 PM

## 2023-08-14 ENCOUNTER — Other Ambulatory Visit: Payer: Self-pay | Admitting: Internal Medicine

## 2023-09-03 ENCOUNTER — Encounter: Payer: Self-pay | Admitting: Cardiology

## 2023-09-03 ENCOUNTER — Ambulatory Visit (INDEPENDENT_AMBULATORY_CARE_PROVIDER_SITE_OTHER): Payer: Self-pay | Admitting: Cardiology

## 2023-09-03 VITALS — BP 128/86 | HR 66 | Ht 60.0 in | Wt 123.4 lb

## 2023-09-03 DIAGNOSIS — E782 Mixed hyperlipidemia: Secondary | ICD-10-CM | POA: Diagnosis not present

## 2023-09-03 DIAGNOSIS — I10 Essential (primary) hypertension: Secondary | ICD-10-CM | POA: Diagnosis not present

## 2023-09-03 NOTE — Progress Notes (Signed)
 Established Patient Office Visit  Subjective:  Patient ID: Jeanne Stewart, female    DOB: [birth year]  Age: 83 y.o. MRN: [id number]  Chief Complaint  Patient presents with   New Patient (Initial Visit)    [facility] patient     Patient in office to establish care. Patient doing well, no complaints today. Son accompanied patient today.  Continue same medications.     No other concerns at this time.   Past Medical History:  Diagnosis Date   Asthma    Hyperlipidemia    Hypertension    Memory changes    Wears dentures    full upper and lower    Past Surgical History:  Procedure Laterality Date   ANKLE FRACTURE SURGERY Right    CATARACT EXTRACTION W/PHACO Right 08/23/2019   Procedure: CATARACT EXTRACTION PHACO AND INTRAOCULAR LENS PLACEMENT (IOC) RIGHT 17.09 01:20.3;  Surgeon: Jaye Fallow, MD;  Location: [facility];  Service: Ophthalmology;  Laterality: Right;   CATARACT EXTRACTION W/PHACO Left 09/27/2019   Procedure: CATARACT EXTRACTION PHACO AND INTRAOCULAR LENS PLACEMENT (IOC) LEFT DIABETIC 14.68 01:13.2;  Surgeon: Jaye Fallow, MD;  Location: [facility];  Service: Ophthalmology;  Laterality: Left;   GASTRIC BYPASS      Social History   Socioeconomic History   Marital status: Married    Spouse name: Not on file   Number of children: Not on file   Years of education: Not on file   Highest education level: Not on file  Occupational History   Not on file  Tobacco Use   Smoking status: Never   Smokeless tobacco: Never  Vaping Use   Vaping status: Never Used  Substance and Sexual Activity   Alcohol use: No   Drug use: Not on file   Sexual activity: Not on file  Other Topics Concern   Not on file  Social History Narrative   Not on file   Social Drivers of Health   Financial Resource Strain: Not on file  Food Insecurity: Patient Declined (02/06/2023)   Hunger Vital Sign    Worried About Running Out of Food in the Last Year: Patient  declined    Ran Out of Food in the Last Year: Patient declined  Transportation Needs: Patient Declined (02/06/2023)   PRAPARE - Administrator, Civil Service (Medical): Patient declined    Lack of Transportation (Non-Medical): Patient declined  Physical Activity: Not on file  Stress: Not on file  Social Connections: Patient Declined (02/06/2023)   Social Connection and Isolation Panel    Frequency of Communication with Friends and Family: Patient declined    Frequency of Social Gatherings with Friends and Family: Patient declined    Attends Religious Services: Patient declined    Database administrator or Organizations: Patient declined    Attends Banker Meetings: Patient declined    Marital Status: Patient declined  Intimate Partner Violence: Patient Declined (02/06/2023)   Humiliation, Afraid, Rape, and Kick questionnaire    Fear of Current or Ex-Partner: Patient declined    Emotionally Abused: Patient declined    Physically Abused: Patient declined    Sexually Abused: Patient declined    History reviewed. No pertinent family history.  No Known Allergies  Outpatient Medications Prior to Visit  Medication Sig   albuterol  (VENTOLIN  HFA) 108 (90 Base) MCG/ACT inhaler Inhale 2 puffs into the lungs every 6 (six) hours as needed for wheezing or shortness of breath.   buPROPion  (WELLBUTRIN  XL) 150 MG  24 hr tablet Take 150 mg by mouth daily.   CALCIUM  PO Take 1 capsule by mouth daily.   donepezil  (ARICEPT ) 5 MG tablet Take 5 mg by mouth at bedtime.   fluticasone  furoate-vilanterol (BREO ELLIPTA ) 100-25 MCG/ACT AEPB INHALE ONE PUFF INTO THE LUNGS ONCE DAILY   lisinopril  (ZESTRIL ) 20 MG tablet Take 20 mg by mouth daily.   rosuvastatin  (CRESTOR ) 10 MG tablet Take 10 mg by mouth daily.   VITAMIN D PO Take 1 capsule by mouth daily.   [DISCONTINUED] mupirocin ointment (BACTROBAN) 2 % Apply 1 Application topically 3 (three) times daily as needed. (Patient not taking:  Reported on 09/03/2023)   No facility-administered medications prior to visit.    Review of Systems  Constitutional: Negative.   HENT: Negative.    Eyes: Negative.   Respiratory: Negative.  Negative for shortness of breath.   Cardiovascular: Negative.  Negative for chest pain.  Gastrointestinal: Negative.  Negative for abdominal pain, constipation and diarrhea.  Genitourinary: Negative.   Musculoskeletal:  Negative for joint pain and myalgias.  Skin: Negative.   Neurological: Negative.  Negative for dizziness and headaches.  Endo/Heme/Allergies: Negative.   All other systems reviewed and are negative.      Objective:   BP 128/86 (BP Location: Right Arm, Patient Position: Sitting, Cuff Size: Small)   Pulse 66   Ht 5' (1.524 m)   Wt 123 lb 6.4 oz (56 kg)   SpO2 91%   BMI 24.10 kg/m   Vitals:   09/03/23 1252 09/03/23 1319  BP: (!) 160/90 128/86  Pulse: 66   Height: 5' (1.524 m)   Weight: 123 lb 6.4 oz (56 kg)   SpO2: 91%   BMI (Calculated): 24.1     Physical Exam Vitals and nursing note reviewed.  Constitutional:      Appearance: Normal appearance. She is normal weight.  HENT:     Head: Normocephalic and atraumatic.     Nose: Nose normal.     Mouth/Throat:     Mouth: Mucous membranes are moist.  Eyes:     Extraocular Movements: Extraocular movements intact.     Conjunctiva/sclera: Conjunctivae normal.     Pupils: Pupils are equal, round, and reactive to light.  Cardiovascular:     Rate and Rhythm: Normal rate and regular rhythm.     Pulses: Normal pulses.     Heart sounds: Normal heart sounds.  Pulmonary:     Effort: Pulmonary effort is normal.     Breath sounds: Normal breath sounds.  Abdominal:     General: Abdomen is flat. Bowel sounds are normal.     Palpations: Abdomen is soft.  Musculoskeletal:        General: Normal range of motion.     Cervical back: Normal range of motion.  Skin:    General: Skin is warm and dry.  Neurological:     General:  No focal deficit present.     Mental Status: She is alert and oriented to person, place, and time.  Psychiatric:        Mood and Affect: Mood normal.        Behavior: Behavior normal.        Thought Content: Thought content normal.        Judgment: Judgment normal.      No results found for any visits on 09/03/23.  No results found for this or any previous visit (from the past 2160 hours).    Assessment & Plan:  Continue same  medications.   Problem List Items Addressed This Visit       Cardiovascular and Mediastinum   Essential hypertension     Other   Mixed hyperlipidemia - Primary    Return in about 6 months (around 03/05/2024).   Total time spent: 25 minutes  [employer], NP  09/03/2023   This document may have been prepared by Dragon Voice Recognition software and as such may include unintentional dictation errors.

## 2023-09-11 ENCOUNTER — Other Ambulatory Visit: Payer: Self-pay | Admitting: Internal Medicine

## 2023-10-05 ENCOUNTER — Telehealth: Payer: Self-pay | Admitting: Cardiology

## 2023-10-05 NOTE — Telephone Encounter (Signed)
 Created in Error-Patients family will bring in form to be completed for [facility]

## 2023-10-13 ENCOUNTER — Ambulatory Visit: Payer: Self-pay | Admitting: Internal Medicine

## 2023-10-13 ENCOUNTER — Other Ambulatory Visit: Payer: Self-pay | Admitting: Cardiology

## 2023-12-04 NOTE — Progress Notes (Addendum)
 [name]                                          MRN: [id number]   12/04/2023   The [facility] Quality Team Specialist reviewed this patient medical record for the purposes of chart review for care gap closure. The following were reviewed: chart review for care gap closure-kidney health evaluation for diabetes:eGFR  and uACR.   Jeanne Stewart                                          MRN: [id number]   12/14/2023   The [facility] Quality Team Specialist reviewed this patient medical record for the purposes of chart review for care gap closure. The following were reviewed: abstraction for care gap closure-controlling blood pressure.    [facility] Quality Team

## 2023-12-09 ENCOUNTER — Other Ambulatory Visit: Payer: Self-pay

## 2023-12-10 MED ORDER — BUPROPION HCL ER (XL) 150 MG PO TB24: 150.0000 mg | ORAL_TABLET | Freq: Every day | ORAL | 3 refills | Status: AC

## 2023-12-10 MED ORDER — ROSUVASTATIN CALCIUM 10 MG PO TABS: 10.0000 mg | ORAL_TABLET | Freq: Every day | ORAL | 3 refills | Status: AC

## 2023-12-10 MED ORDER — LISINOPRIL 20 MG PO TABS: 20.0000 mg | ORAL_TABLET | Freq: Every day | ORAL | 1 refills | Status: AC

## 2023-12-15 ENCOUNTER — Other Ambulatory Visit: Payer: Self-pay | Admitting: Cardiology

## 2024-01-09 ENCOUNTER — Emergency Department

## 2024-01-09 ENCOUNTER — Emergency Department
Admission: EM | Admit: 2024-01-09 | Discharge: 2024-01-09 | Disposition: A | Discharge status: Home / Self Care | Attending: Emergency Medicine | Admitting: Emergency Medicine

## 2024-01-09 ENCOUNTER — Other Ambulatory Visit: Payer: Self-pay

## 2024-01-09 DIAGNOSIS — W19XXXA Unspecified fall, initial encounter: Secondary | ICD-10-CM | POA: Diagnosis not present

## 2024-01-09 DIAGNOSIS — E785 Hyperlipidemia, unspecified: Secondary | ICD-10-CM | POA: Diagnosis not present

## 2024-01-09 DIAGNOSIS — F02B18 Dementia in other diseases classified elsewhere, moderate, with other behavioral disturbance: Secondary | ICD-10-CM | POA: Diagnosis not present

## 2024-01-09 DIAGNOSIS — M4322 Fusion of spine, cervical region: Secondary | ICD-10-CM | POA: Diagnosis not present

## 2024-01-09 DIAGNOSIS — G309 Alzheimer's disease, unspecified: Secondary | ICD-10-CM | POA: Insufficient documentation

## 2024-01-09 DIAGNOSIS — F03B18 Unspecified dementia, moderate, with other behavioral disturbance: Secondary | ICD-10-CM | POA: Insufficient documentation

## 2024-01-09 DIAGNOSIS — R600 Localized edema: Secondary | ICD-10-CM | POA: Insufficient documentation

## 2024-01-09 DIAGNOSIS — J45909 Unspecified asthma, uncomplicated: Secondary | ICD-10-CM | POA: Diagnosis not present

## 2024-01-09 DIAGNOSIS — I10 Essential (primary) hypertension: Secondary | ICD-10-CM | POA: Insufficient documentation

## 2024-01-09 DIAGNOSIS — M25561 Pain in right knee: Secondary | ICD-10-CM | POA: Insufficient documentation

## 2024-01-09 DIAGNOSIS — Y92019 Unspecified place in single-family (private) house as the place of occurrence of the external cause: Secondary | ICD-10-CM | POA: Diagnosis not present

## 2024-01-09 DIAGNOSIS — S0990XA Unspecified injury of head, initial encounter: Secondary | ICD-10-CM | POA: Diagnosis present

## 2024-01-09 LAB — CBC WITH DIFFERENTIAL/PLATELET
Abs Immature Granulocytes: 0.01 K/uL (ref 0.00–0.07)
Basophils Absolute: 0 K/uL (ref 0.0–0.1)
Basophils Relative: 0 %
Eosinophils Absolute: 0 K/uL (ref 0.0–0.5)
Eosinophils Relative: 1 %
HCT: 43.3 % (ref 36.0–46.0)
Hemoglobin: 13.9 g/dL (ref 12.0–15.0)
Immature Granulocytes: 0 %
Lymphocytes Relative: 16 %
Lymphs Abs: 0.8 K/uL (ref 0.7–4.0)
MCH: 29.7 pg (ref 26.0–34.0)
MCHC: 32.1 g/dL (ref 30.0–36.0)
MCV: 92.5 fL (ref 80.0–100.0)
Monocytes Absolute: 0.4 K/uL (ref 0.1–1.0)
Monocytes Relative: 7 %
Neutro Abs: 3.8 K/uL (ref 1.7–7.7)
Neutrophils Relative %: 76 %
Platelets: 150 K/uL (ref 150–400)
RBC: 4.68 MIL/uL (ref 3.87–5.11)
RDW: 13.1 % (ref 11.5–15.5)
Smear Review: NORMAL
WBC: 5 K/uL (ref 4.0–10.5)
nRBC: 0 % (ref 0.0–0.2)

## 2024-01-09 LAB — TROPONIN T, HIGH SENSITIVITY
Troponin T High Sensitivity: 21 ng/L — ABNORMAL HIGH (ref 0–19)
Troponin T High Sensitivity: 20 ng/L — ABNORMAL HIGH (ref 0–19)

## 2024-01-09 LAB — COMPREHENSIVE METABOLIC PANEL WITH GFR
ALT: 8 U/L (ref 0–44)
AST: 31 U/L (ref 15–41)
Albumin: 4.1 g/dL (ref 3.5–5.0)
Alkaline Phosphatase: 78 U/L (ref 38–126)
Anion gap: 9 (ref 5–15)
BUN: 12 mg/dL (ref 8–23)
CO2: 31 mmol/L (ref 22–32)
Calcium: 9.3 mg/dL (ref 8.9–10.3)
Chloride: 106 mmol/L (ref 98–111)
Creatinine, Ser: 0.79 mg/dL (ref 0.44–1.00)
GFR, Estimated: >60 mL/min
Glucose, Bld: 109 mg/dL — ABNORMAL HIGH (ref 70–99)
Potassium: 3.6 mmol/L (ref 3.5–5.1)
Sodium: 146 mmol/L — ABNORMAL HIGH (ref 135–145)
Total Bilirubin: 0.6 mg/dL (ref 0.0–1.2)
Total Protein: 6.6 g/dL (ref 6.5–8.1)

## 2024-01-09 LAB — CK: Total CK: 785 U/L — ABNORMAL HIGH (ref 38–234)

## 2024-01-09 LAB — URINALYSIS, ROUTINE W REFLEX MICROSCOPIC
Bilirubin Urine: NEGATIVE
Glucose, UA: NEGATIVE mg/dL
Hgb urine dipstick: NEGATIVE
Ketones, ur: NEGATIVE mg/dL
Leukocytes,Ua: NEGATIVE
Nitrite: NEGATIVE
Protein, ur: NEGATIVE mg/dL
Specific Gravity, Urine: 1.012 (ref 1.005–1.030)
pH: 7 (ref 5.0–8.0)

## 2024-01-09 MED ORDER — BREXPIPRAZOLE 0.25 MG PO TABS: 0.2500 mg | ORAL_TABLET | Freq: Every day | ORAL | 0 refills | Status: DC

## 2024-01-09 NOTE — ED Triage Notes (Signed)
 Pt to ED [facility] from home for unwitnessed fall, found by family at 1100 this am. Hx alzheimer's. Denies pain.

## 2024-01-09 NOTE — ED Notes (Signed)
 Bed alarm, fall risk bracelet in place

## 2024-01-09 NOTE — ED Provider Notes (Signed)
 "  [facility] Provider Note   Event Date/Time   First MD Initiated Contact with Patient 01/09/24 1226     (approximate) History  Fall  HPI Jeanne Stewart is a 84 y.o. female with a past medical history of dementia, hyperlipidemia, hypertension, and asthma who presents via EMS from home after an unwitnessed fall and being found by family today at 27 AM.  Patient has no memory of this fall and family not at bedside to provide further history at this time.  Patient currently denies any complaints ROS: Patient currently denies any vision changes, tinnitus, difficulty speaking, facial droop, sore throat, chest pain, shortness of breath, abdominal pain, nausea/vomiting/diarrhea, dysuria, or weakness/numbness/paresthesias in any extremity   Physical Exam  Triage Vital Signs: ED Triage Vitals  Encounter Vitals Group     BP 01/09/24 1230 (!) 150/102     Girls Systolic BP Percentile --      Girls Diastolic BP Percentile --      Boys Systolic BP Percentile --      Boys Diastolic BP Percentile --      Pulse Rate 01/09/24 1226 68     Resp 01/09/24 1226 16     Temp 01/09/24 1226 97.9 F (36.6 C)     Temp src --      SpO2 01/09/24 1226 98 %     Weight 01/09/24 1222 132 lb 4.4 oz (60 kg)     Height --      Head Circumference --      Peak Flow --      Pain Score 01/09/24 1222 0     Pain Loc --      Pain Education --      Exclude from Growth Chart --    Most recent vital signs: Vitals:   01/09/24 1230 01/09/24 1400  BP: (!) 150/102 (!) 172/87  Pulse:  64  Resp:  13  Temp:    SpO2:     General: Awake, cooperative.  Disoriented CV:  Good peripheral perfusion. Resp:  Normal effort. Abd:  No distention. Other:  Elderly overweight Caucasian female resting comfortably in no acute distress.  Patient has mild edema to right knee joint circumferentially with no other acute signs of trauma. ED Results / Procedures / Treatments  Labs (all labs ordered are listed, but  only abnormal results are displayed) Labs Reviewed  COMPREHENSIVE METABOLIC PANEL WITH GFR - Abnormal; Notable for the following components:      Result Value   Sodium 146 (*)    Glucose, Bld 109 (*)    All other components within normal limits  CK - Abnormal; Notable for the following components:   Total CK 785 (*)    All other components within normal limits  TROPONIN T, HIGH SENSITIVITY - Abnormal; Notable for the following components:   Troponin T High Sensitivity 21 (*)    All other components within normal limits  TROPONIN T, HIGH SENSITIVITY - Abnormal; Notable for the following components:   Troponin T High Sensitivity 20 (*)    All other components within normal limits  CBC WITH DIFFERENTIAL/PLATELET  URINALYSIS, ROUTINE W REFLEX MICROSCOPIC   EKG ED ECG REPORT I, Artist MARLA Kerns, the attending physician, personally viewed and interpreted this ECG. Date: 01/09/2024 EKG Time: 1226 Rate: 66 Rhythm: normal sinus rhythm QRS Axis: normal Intervals: normal ST/T Wave abnormalities: normal Narrative Interpretation: no evidence of acute ischemia RADIOLOGY ED MD interpretation: CT of the head/neck showed no evidence  of acute abnormalities  Portable x-ray of the right knee shows no acute fracture or dislocation - All radiology independently interpreted and agree with radiology assessment Official radiology report(s): CT Cervical Spine Wo Contrast Result Date: 01/09/2024 EXAM: CT CERVICAL SPINE WITHOUT CONTRAST 01/09/2024 01:38:36 PM TECHNIQUE: CT of the cervical spine was performed without the administration of intravenous contrast. Multiplanar reformatted images are provided for review. Automated exposure control, iterative reconstruction, and/or weight based adjustment of the mA/kV was utilized to reduce the radiation dose to as low as reasonably achievable. COMPARISON: None available. CLINICAL HISTORY: Unremarkable fall. Patient found on ground at 11 o'clock am. FINDINGS: BONES  AND ALIGNMENT: No acute fracture or traumatic malalignment. DEGENERATIVE CHANGES: Ankylosis is present across the disc space at C3-C4, C4-C5, C5-C6, and C6-C7. Multivertebral spurring contributes to moderate foraminal narrowing bilaterally at C4-C5 and C5-C6. SOFT TISSUES: No prevertebral soft tissue swelling. Surgical clips are present in the neck bilaterally. Atherosclerotic calcifications are present at the carotid arteries bilaterally. IMPRESSION: 1. No acute fracture or traumatic malalignment. 2. 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 Electronically signed by: Lonni Necessary MD 01/09/2024 01:55 PM EST RP Workstation: [id number]   CT Head Wo Contrast Result Date: 01/09/2024 EXAM: CT HEAD WITHOUT CONTRAST 01/09/2024 01:38:36 PM TECHNIQUE: CT of the head was performed without the administration of intravenous contrast. Automated exposure control, iterative reconstruction, and/or weight based adjustment of the mA/kV was utilized to reduce the radiation dose to as low as reasonably achievable. COMPARISON: 08/09/2022 CLINICAL HISTORY: Head trauma, minor (Age >= 65y). Unremarkable fall. Found on ground by family at 11 o'clock am today. FINDINGS: BRAIN AND VENTRICLES: No acute hemorrhage. No evidence of acute infarct. No hydrocephalus. No extra-axial collection. No mass effect or midline shift. Moderate atrophy and calvarial changes are similar to the prior exam. Atherosclerotic calcifications are present in the cavernous carotid arteries bilaterally and at the dural margin of both vertebral arteries. No hyperdense vessel is present. ORBITS: No acute abnormality. SINUSES: No acute abnormality. SOFT TISSUES AND SKULL: Mild soft tissue swelling is present in the right occipital scalp without underlying fracture or foreign body. No skull fracture. IMPRESSION: 1. No acute intracranial abnormality. 2. Mild soft tissue swelling in the right occipital scalp without underlying  fracture or foreign body. Electronically signed by: Lonni Necessary MD 01/09/2024 01:50 PM EST RP Workstation: [id number]   DG Knee Right Port Result Date: 01/09/2024 EXAM: 1 VIEW(S) XRAY OF THE RIGHT KNEE 01/09/2024 12:52:00 PM COMPARISON: None available. CLINICAL HISTORY: fall w/ swelling and pain w/ ROM FINDINGS: BONES AND JOINTS: No acute fracture. No malalignment. No significant joint effusion. Tricompartmental osteophyte formation with chondrocalcinosis. Moderate patellofemoral joint space narrowing. SOFT TISSUES: The soft tissues are unremarkable. IMPRESSION: 1. No acute fracture or dislocation. Electronically signed by: Morgane Naveau MD 01/09/2024 01:00 PM EST RP Workstation: [id number]   PROCEDURES: Critical Care performed: No Procedures MEDICATIONS ORDERED IN ED: Medications - No data to display IMPRESSION / MDM / ASSESSMENT AND PLAN / ED COURSE  I reviewed the triage vital signs and the nursing notes.                             The patient is on the cardiac monitor to evaluate for evidence of arrhythmia and/or significant heart rate changes. Patient's presentation is most consistent with acute presentation with potential threat to life or bodily function. Patient is an 83 year old female presents via EMS after being found down  for unknown period of time.  Patient is a poor historian and cannot provide a timeline of events.  Patient shows no external signs of trauma on exam other than a swollen right knee DDx: Vasovagal syncope, orthostatic syncope, arrhythmia, ACS, skull fracture, right knee fracture Plan: CBC, CMP, troponin, CK, EKG, head/neck CT, x-ray of the right knee  Patient's laboratory and radiologic valuation only positive for mild troponin elevation to 24.  Immediately downtrending to 20 on repeat.  Patient denies any chest pain previous to arrival or throughout her emergency department course and therefore I have low suspicion for acute ACS.  I discussed these results  with family as well who agree with plan for patient to be discharged with outpatient follow-up.  Patient also started on Rexulti  0.25 mg daily with anticipation of follow-up with their primary care physician for monitoring of side effects and possible increase if patient tolerates.  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   Josanne Boerema K, MD 01/09/24 [phone]  "

## 2024-01-09 NOTE — ED Notes (Signed)
Pt assisted to toilet 

## 2024-01-12 ENCOUNTER — Telehealth: Payer: Self-pay

## 2024-01-12 NOTE — Telephone Encounter (Signed)
 [facility] called asking for a new rx to be sent in for the rexulti , she states that the ED sent her 14 days and they can't break the box

## 2024-01-13 ENCOUNTER — Other Ambulatory Visit: Payer: Self-pay | Admitting: Cardiology

## 2024-01-13 MED ORDER — BREXPIPRAZOLE 0.25 MG PO TABS: 0.2500 mg | ORAL_TABLET | Freq: Every day | ORAL | 0 refills | Status: DC

## 2024-01-14 ENCOUNTER — Telehealth: Payer: Self-pay

## 2024-01-14 NOTE — Telephone Encounter (Signed)
 Left a vm.

## 2024-01-14 NOTE — Telephone Encounter (Signed)
 Jeanne Stewart LM asking for call back about a medication that the patient is supposed to be taking. The family member that called is not on the patients HIPAA form so we are unable to call and give them information regarding this

## 2024-01-18 NOTE — Telephone Encounter (Signed)
 PA was approved.

## 2024-01-20 ENCOUNTER — Other Ambulatory Visit: Payer: Self-pay | Admitting: Cardiology

## 2024-02-02 ENCOUNTER — Other Ambulatory Visit: Payer: Self-pay | Admitting: Cardiology

## 2024-02-09 ENCOUNTER — Emergency Department
Admission: EM | Admit: 2024-02-09 | Discharge: 2024-02-09 | Disposition: A | Discharge status: Home / Self Care | Attending: Emergency Medicine | Admitting: Emergency Medicine

## 2024-02-09 ENCOUNTER — Emergency Department

## 2024-02-09 ENCOUNTER — Other Ambulatory Visit: Payer: Self-pay

## 2024-02-09 DIAGNOSIS — M25552 Pain in left hip: Secondary | ICD-10-CM | POA: Insufficient documentation

## 2024-02-09 DIAGNOSIS — W010XXA Fall on same level from slipping, tripping and stumbling without subsequent striking against object, initial encounter: Secondary | ICD-10-CM | POA: Insufficient documentation

## 2024-02-09 DIAGNOSIS — W19XXXA Unspecified fall, initial encounter: Secondary | ICD-10-CM

## 2024-02-09 DIAGNOSIS — R1032 Left lower quadrant pain: Secondary | ICD-10-CM | POA: Insufficient documentation

## 2024-02-09 DIAGNOSIS — I10 Essential (primary) hypertension: Secondary | ICD-10-CM | POA: Insufficient documentation

## 2024-02-09 DIAGNOSIS — R0789 Other chest pain: Secondary | ICD-10-CM | POA: Insufficient documentation

## 2024-02-09 DIAGNOSIS — R1031 Right lower quadrant pain: Secondary | ICD-10-CM | POA: Insufficient documentation

## 2024-02-09 DIAGNOSIS — J45909 Unspecified asthma, uncomplicated: Secondary | ICD-10-CM | POA: Insufficient documentation

## 2024-02-09 LAB — TROPONIN T, HIGH SENSITIVITY
Troponin T High Sensitivity: 22 ng/L — ABNORMAL HIGH (ref 0–19)
Troponin T High Sensitivity: 20 ng/L — ABNORMAL HIGH (ref 0–19)

## 2024-02-09 LAB — URINALYSIS, ROUTINE W REFLEX MICROSCOPIC
Bilirubin Urine: NEGATIVE
Glucose, UA: NEGATIVE mg/dL
Hgb urine dipstick: NEGATIVE
Ketones, ur: NEGATIVE mg/dL
Nitrite: NEGATIVE
Protein, ur: NEGATIVE mg/dL
Specific Gravity, Urine: 1.023 (ref 1.005–1.030)
Squamous Epithelial / HPF: 0 /HPF (ref 0–5)
pH: 6 (ref 5.0–8.0)

## 2024-02-09 LAB — CBC WITH DIFFERENTIAL/PLATELET
Abs Immature Granulocytes: 0.02 10*3/uL (ref 0.00–0.07)
Basophils Absolute: 0 10*3/uL (ref 0.0–0.1)
Basophils Relative: 1 %
Eosinophils Absolute: 0.1 10*3/uL (ref 0.0–0.5)
Eosinophils Relative: 3 %
HCT: 40.7 % (ref 36.0–46.0)
Hemoglobin: 13.1 g/dL (ref 12.0–15.0)
Immature Granulocytes: 1 %
Lymphocytes Relative: 28 %
Lymphs Abs: 1.1 10*3/uL (ref 0.7–4.0)
MCH: 30.1 pg (ref 26.0–34.0)
MCHC: 32.2 g/dL (ref 30.0–36.0)
MCV: 93.6 fL (ref 80.0–100.0)
Monocytes Absolute: 0.4 10*3/uL (ref 0.1–1.0)
Monocytes Relative: 10 %
Neutro Abs: 2.3 10*3/uL (ref 1.7–7.7)
Neutrophils Relative %: 57 %
Platelets: 152 10*3/uL (ref 150–400)
RBC: 4.35 MIL/uL (ref 3.87–5.11)
RDW: 13.1 % (ref 11.5–15.5)
WBC: 3.8 10*3/uL — ABNORMAL LOW (ref 4.0–10.5)
nRBC: 0 % (ref 0.0–0.2)

## 2024-02-09 LAB — COMPREHENSIVE METABOLIC PANEL WITH GFR
ALT: 8 U/L (ref 0–44)
AST: 22 U/L (ref 15–41)
Albumin: 4.3 g/dL (ref 3.5–5.0)
Alkaline Phosphatase: 78 U/L (ref 38–126)
Anion gap: 12 (ref 5–15)
BUN: 11 mg/dL (ref 8–23)
CO2: 29 mmol/L (ref 22–32)
Calcium: 9.1 mg/dL (ref 8.9–10.3)
Chloride: 104 mmol/L (ref 98–111)
Creatinine, Ser: 0.8 mg/dL (ref 0.44–1.00)
GFR, Estimated: >60 mL/min
Glucose, Bld: 104 mg/dL — ABNORMAL HIGH (ref 70–99)
Potassium: 3.3 mmol/L — ABNORMAL LOW (ref 3.5–5.1)
Sodium: 145 mmol/L (ref 135–145)
Total Bilirubin: 0.4 mg/dL (ref 0.0–1.2)
Total Protein: 6.9 g/dL (ref 6.5–8.1)

## 2024-02-09 MED ORDER — IOHEXOL 300 MG/ML  SOLN
100.0000 mL | Freq: Once | INTRAMUSCULAR | Status: AC | PRN
  Administered 2024-02-09: 100 mL via INTRAVENOUS

## 2024-02-09 NOTE — Discharge Instructions (Signed)
 Follow-up with your regular doctor as needed.  Your CTs and labs are all reassuring.  Your heart appears to be normal.  You do not have any broken bones.  You have a old fracture on T10 that appears to be stable.  Return emergency department if you are worsening.

## 2024-02-09 NOTE — ED Notes (Signed)
 Pt to CT

## 2024-02-09 NOTE — ED Notes (Signed)
 2 unsuccessful IV attempts. Asked another RN to start an IV/

## 2024-02-09 NOTE — ED Notes (Signed)
 Unsuccessful IV attempt x2.  Will consult IV team.

## 2024-02-10 ENCOUNTER — Other Ambulatory Visit: Payer: Self-pay

## 2024-02-11 MED ORDER — BREXPIPRAZOLE 0.25 MG PO TABS: 0.2500 mg | ORAL_TABLET | Freq: Every day | ORAL | 0 refills | Status: AC

## 2024-03-03 ENCOUNTER — Ambulatory Visit: Admitting: Cardiology

## 2024-04-26 IMAGING — CT CT HEAD W/O CM
3 of 4 series · 13 of 47 positions shown, 15 images · non-contrast
Comparison: CT head 09/23/2017

CLINICAL DATA: Fall 3 days ago with head injury



[Series 2: axial st head 5.00 ax · axial · 0.33mm/px · z∈[-514,-399]mm · 7 of 31 slices shown, 9 images]
[im 4/31  brain]
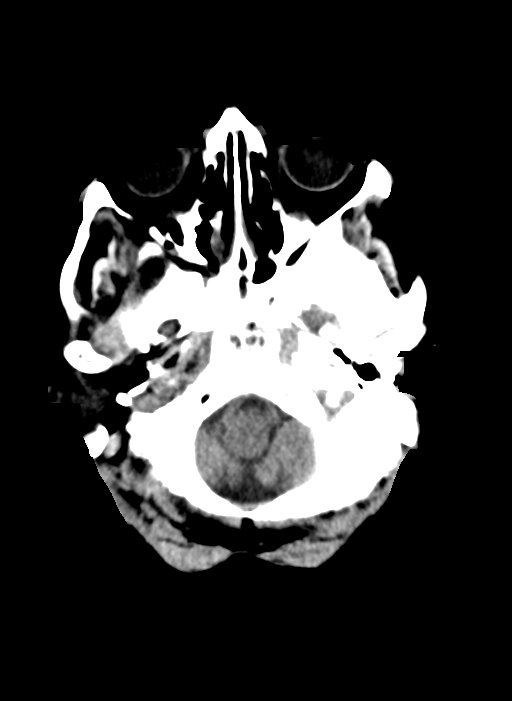
[im 4/31  bone]
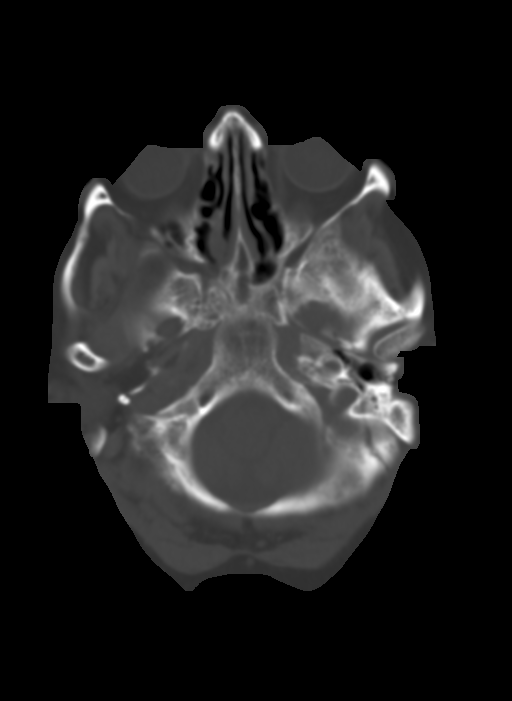
[im 8/31  brain]
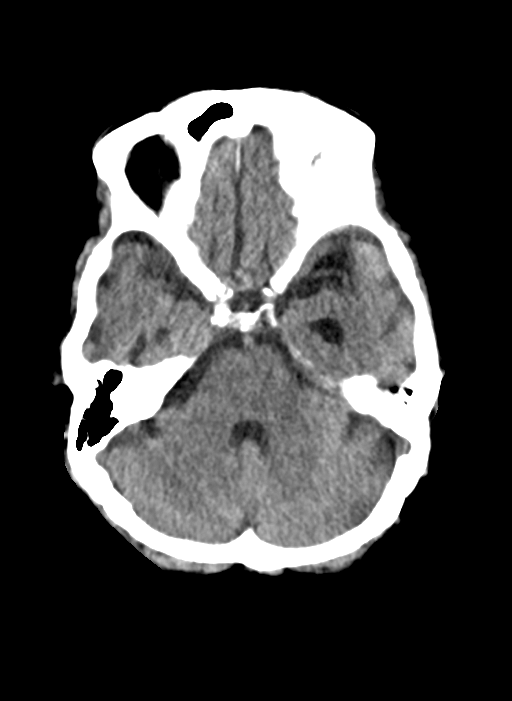
[im 12/31  brain]
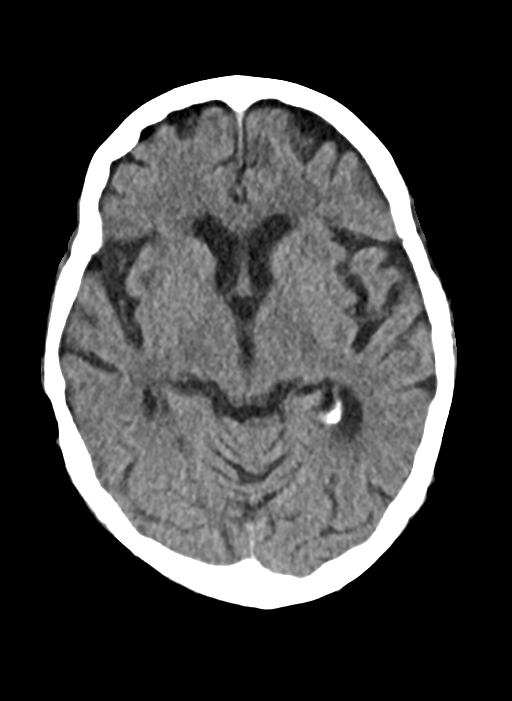
[im 16/31  brain]
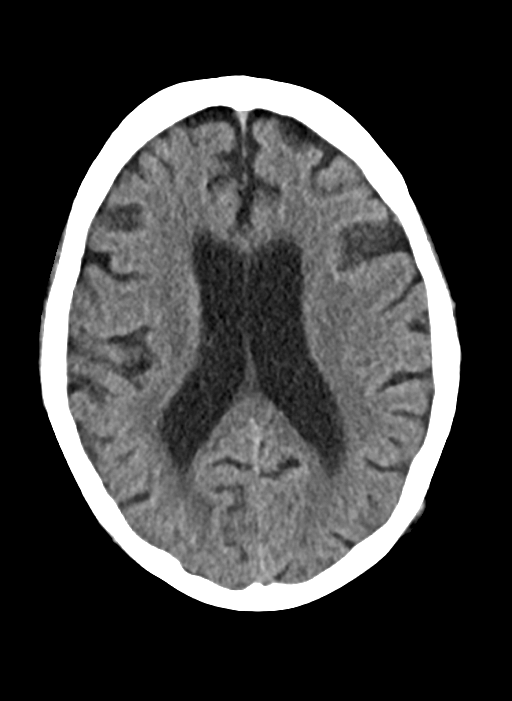
[im 19/31  brain]
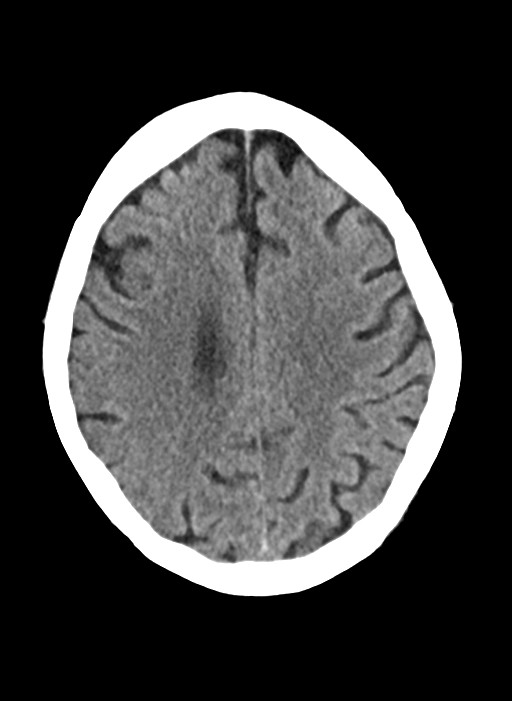
[im 19/31  bone]
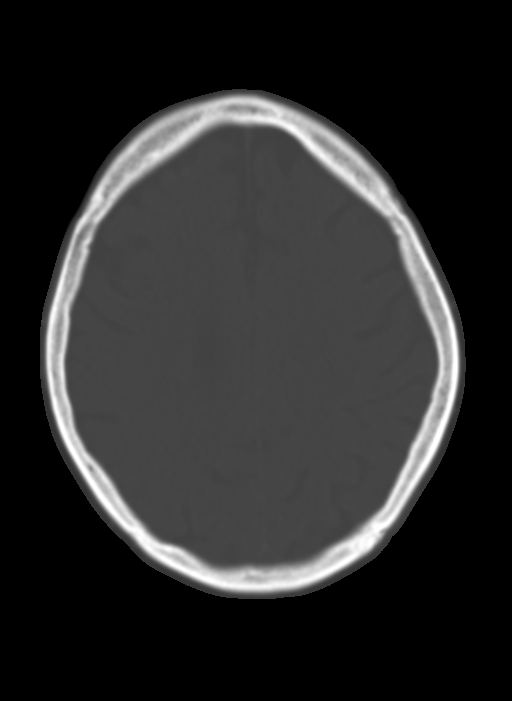
[im 23/31  brain]
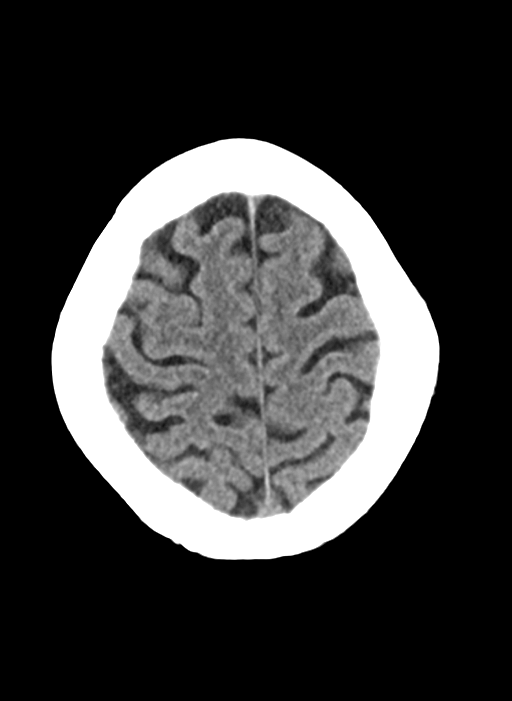
[im 27/31  brain]
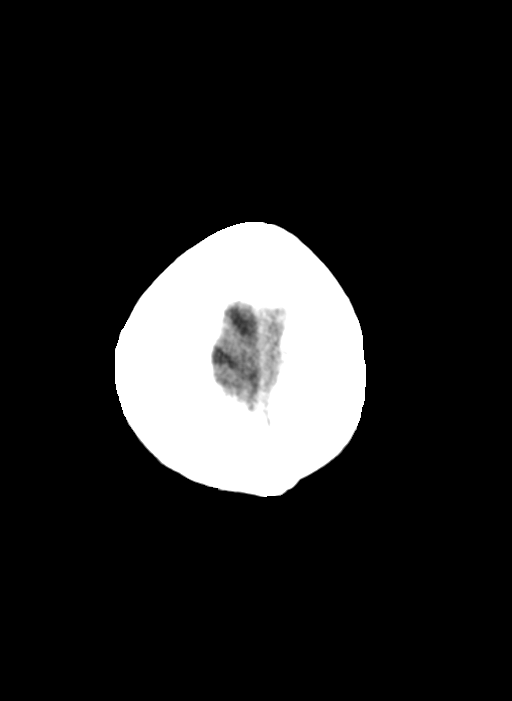

[Series 6: coronals head 3.00 cor · coronal · 0.30mm/px · 3 of 77 slices shown]
[im 26/77  brain]
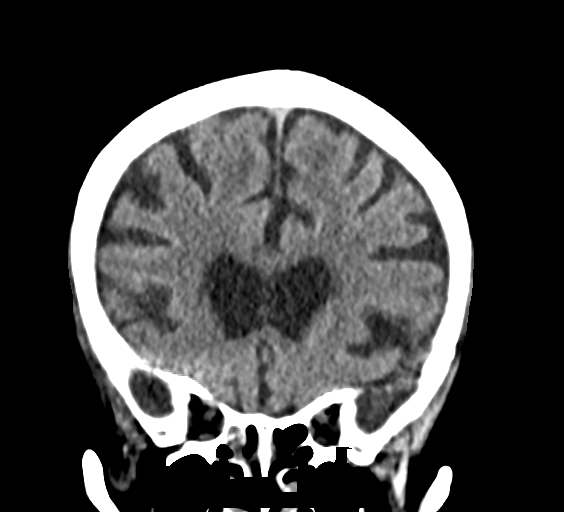
[im 34/77  brain]
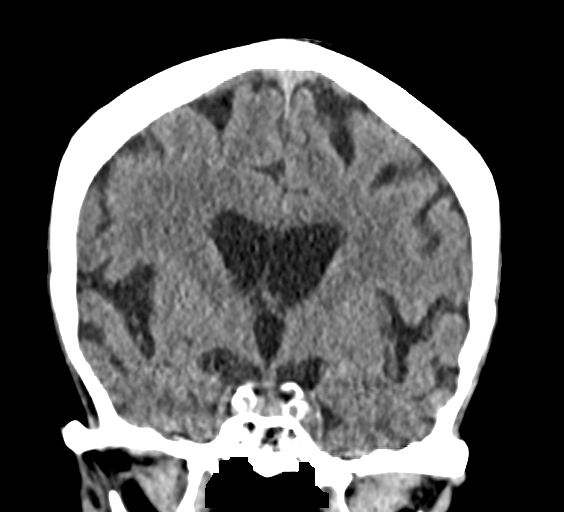
[im 43/77  brain]
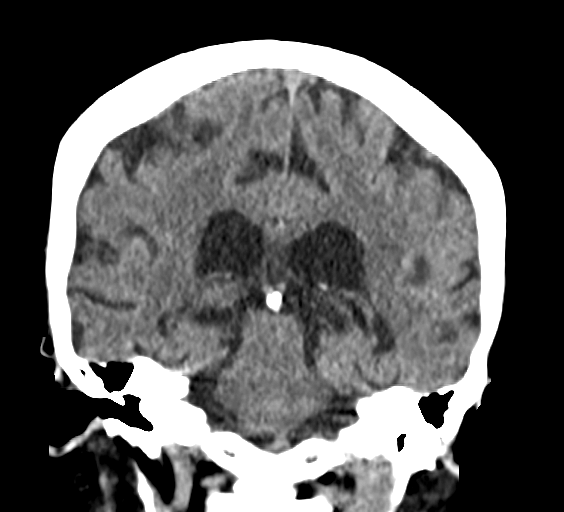

[Series 8: sagittals head 3.00 sag · sagittal · 0.30mm/px · 3 of 57 slices shown]
[im 19/57  brain]
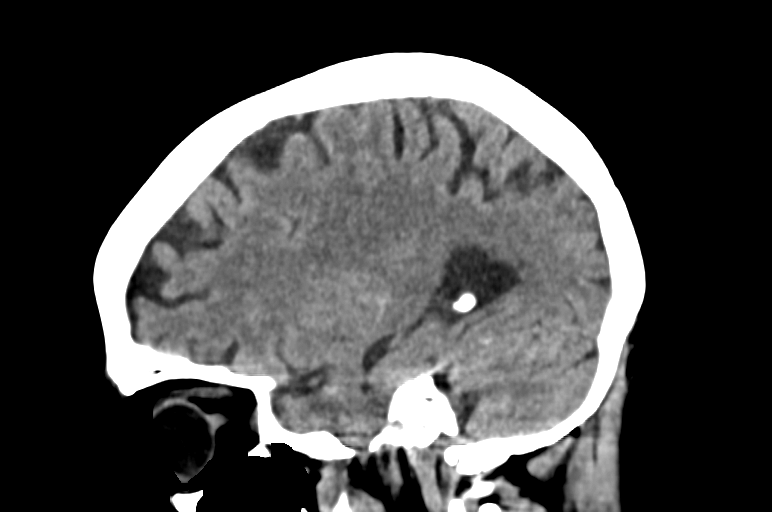
[im 29/57  brain]
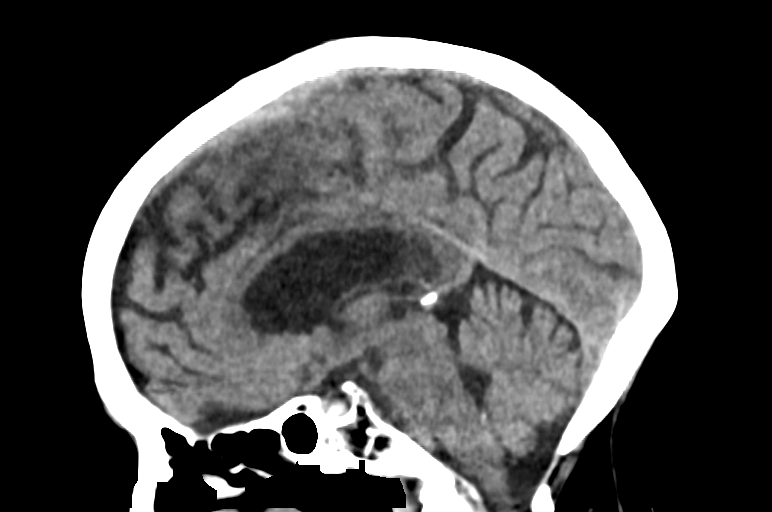
[im 38/57  brain]
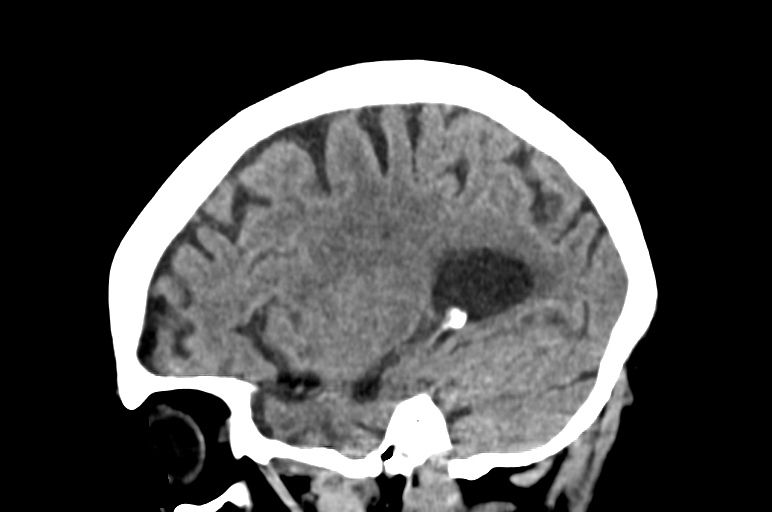

[13 of 47 positions shown; findings below may reference images not displayed]

FINDINGS: Brain: Mild atrophy.  Mild white matter hypodensity.

Negative for acute infarct, hemorrhage, mass

Vascular: Negative for hyperdense vessel

Skull: Negative

Sinuses/Orbits: Negative

Other: None
IMPRESSION: No acute abnormality

Mild atrophy and mild chronic microvascular ischemic change in the
white matter.
